# Patient Record
Sex: Female | Born: 1945 | Race: White | Hispanic: No | Marital: Married | Smoking: Former smoker
Health system: Southern US, Community
[De-identification: ages and names within clinical notes are randomized; demographics above are authoritative.]

## PROBLEM LIST (undated history)

## (undated) DIAGNOSIS — R430 Anosmia: Secondary | ICD-10-CM

## (undated) DIAGNOSIS — M549 Dorsalgia, unspecified: Secondary | ICD-10-CM

## (undated) DIAGNOSIS — M2550 Pain in unspecified joint: Secondary | ICD-10-CM

## (undated) DIAGNOSIS — C801 Malignant (primary) neoplasm, unspecified: Secondary | ICD-10-CM

## (undated) DIAGNOSIS — N816 Rectocele: Secondary | ICD-10-CM

## (undated) DIAGNOSIS — R432 Parageusia: Secondary | ICD-10-CM

## (undated) DIAGNOSIS — R609 Edema, unspecified: Secondary | ICD-10-CM

## (undated) DIAGNOSIS — K829 Disease of gallbladder, unspecified: Secondary | ICD-10-CM

## (undated) DIAGNOSIS — G510 Bell's palsy: Secondary | ICD-10-CM

## (undated) DIAGNOSIS — R7303 Prediabetes: Secondary | ICD-10-CM

## (undated) DIAGNOSIS — E785 Hyperlipidemia, unspecified: Secondary | ICD-10-CM

## (undated) DIAGNOSIS — R002 Palpitations: Secondary | ICD-10-CM

## (undated) DIAGNOSIS — R1310 Dysphagia, unspecified: Secondary | ICD-10-CM

## (undated) DIAGNOSIS — R0602 Shortness of breath: Secondary | ICD-10-CM

## (undated) DIAGNOSIS — I4891 Unspecified atrial fibrillation: Secondary | ICD-10-CM

## (undated) DIAGNOSIS — I10 Essential (primary) hypertension: Secondary | ICD-10-CM

## (undated) DIAGNOSIS — I8390 Asymptomatic varicose veins of unspecified lower extremity: Secondary | ICD-10-CM

## (undated) HISTORY — DX: Unspecified atrial fibrillation: I48.91

## (undated) HISTORY — DX: Dorsalgia, unspecified: M54.9

## (undated) HISTORY — DX: Prediabetes: R73.03

## (undated) HISTORY — PX: AV FISTULA PLACEMENT: SHX1204

## (undated) HISTORY — DX: Dysphagia, unspecified: R13.10

## (undated) HISTORY — DX: Malignant (primary) neoplasm, unspecified: C80.1

## (undated) HISTORY — DX: Shortness of breath: R06.02

## (undated) HISTORY — DX: Anosmia: R43.0

## (undated) HISTORY — DX: Palpitations: R00.2

## (undated) HISTORY — DX: Parageusia: R43.2

## (undated) HISTORY — DX: Rectocele: N81.6

## (undated) HISTORY — DX: Asymptomatic varicose veins of unspecified lower extremity: I83.90

## (undated) HISTORY — DX: Pain in unspecified joint: M25.50

## (undated) HISTORY — PX: VEIN REPAIR: SHX6524

## (undated) HISTORY — DX: Hyperlipidemia, unspecified: E78.5

## (undated) HISTORY — PX: ANKLE SURGERY: SHX546

## (undated) HISTORY — DX: Essential (primary) hypertension: I10

## (undated) HISTORY — PX: GASTRIC BYPASS: SHX52

## (undated) HISTORY — PX: OTHER SURGICAL HISTORY: SHX169

## (undated) HISTORY — PX: CHOLECYSTECTOMY: SHX55

## (undated) HISTORY — DX: Disease of gallbladder, unspecified: K82.9

## (undated) HISTORY — DX: Edema, unspecified: R60.9

## (undated) HISTORY — DX: Bell's palsy: G51.0

---

## 2008-03-01 DIAGNOSIS — G510 Bell's palsy: Secondary | ICD-10-CM

## 2008-03-01 HISTORY — DX: Bell's palsy: G51.0

## 2014-03-21 DIAGNOSIS — Z9884 Bariatric surgery status: Secondary | ICD-10-CM | POA: Diagnosis not present

## 2014-03-21 DIAGNOSIS — K8012 Calculus of gallbladder with acute and chronic cholecystitis without obstruction: Secondary | ICD-10-CM | POA: Diagnosis present

## 2014-03-21 DIAGNOSIS — E669 Obesity, unspecified: Secondary | ICD-10-CM | POA: Diagnosis present

## 2014-03-21 DIAGNOSIS — K8060 Calculus of gallbladder and bile duct with cholecystitis, unspecified, without obstruction: Secondary | ICD-10-CM | POA: Diagnosis not present

## 2014-03-21 DIAGNOSIS — I4891 Unspecified atrial fibrillation: Secondary | ICD-10-CM | POA: Diagnosis present

## 2014-03-21 DIAGNOSIS — Z6841 Body Mass Index (BMI) 40.0 and over, adult: Secondary | ICD-10-CM | POA: Diagnosis not present

## 2014-03-21 DIAGNOSIS — N179 Acute kidney failure, unspecified: Secondary | ICD-10-CM | POA: Diagnosis present

## 2014-03-21 DIAGNOSIS — E869 Volume depletion, unspecified: Secondary | ICD-10-CM | POA: Diagnosis present

## 2014-03-21 DIAGNOSIS — N189 Chronic kidney disease, unspecified: Secondary | ICD-10-CM | POA: Diagnosis present

## 2014-03-21 DIAGNOSIS — I129 Hypertensive chronic kidney disease with stage 1 through stage 4 chronic kidney disease, or unspecified chronic kidney disease: Secondary | ICD-10-CM | POA: Diagnosis present

## 2014-03-21 DIAGNOSIS — E785 Hyperlipidemia, unspecified: Secondary | ICD-10-CM | POA: Diagnosis present

## 2014-03-21 DIAGNOSIS — E876 Hypokalemia: Secondary | ICD-10-CM | POA: Diagnosis present

## 2014-03-21 DIAGNOSIS — R7309 Other abnormal glucose: Secondary | ICD-10-CM | POA: Diagnosis present

## 2014-03-29 DIAGNOSIS — I4891 Unspecified atrial fibrillation: Secondary | ICD-10-CM | POA: Diagnosis not present

## 2014-05-10 DIAGNOSIS — R0683 Snoring: Secondary | ICD-10-CM | POA: Diagnosis not present

## 2014-05-10 DIAGNOSIS — G4710 Hypersomnia, unspecified: Secondary | ICD-10-CM | POA: Diagnosis not present

## 2015-12-05 ENCOUNTER — Ambulatory Visit (INDEPENDENT_AMBULATORY_CARE_PROVIDER_SITE_OTHER): Payer: Managed Care, Other (non HMO) | Admitting: *Deleted

## 2015-12-05 DIAGNOSIS — Z23 Encounter for immunization: Secondary | ICD-10-CM | POA: Diagnosis not present

## 2016-03-02 DIAGNOSIS — J069 Acute upper respiratory infection, unspecified: Secondary | ICD-10-CM | POA: Diagnosis not present

## 2016-04-06 ENCOUNTER — Encounter (INDEPENDENT_AMBULATORY_CARE_PROVIDER_SITE_OTHER): Payer: Managed Care, Other (non HMO) | Admitting: Family Medicine

## 2016-04-15 ENCOUNTER — Ambulatory Visit (INDEPENDENT_AMBULATORY_CARE_PROVIDER_SITE_OTHER): Payer: Managed Care, Other (non HMO) | Admitting: Family Medicine

## 2016-04-21 ENCOUNTER — Encounter (INDEPENDENT_AMBULATORY_CARE_PROVIDER_SITE_OTHER): Payer: Self-pay | Admitting: Family Medicine

## 2016-04-21 ENCOUNTER — Ambulatory Visit (INDEPENDENT_AMBULATORY_CARE_PROVIDER_SITE_OTHER): Payer: Managed Care, Other (non HMO) | Admitting: Family Medicine

## 2016-04-21 VITALS — BP 107/73 | HR 58 | Temp 97.9°F | Ht 63.0 in | Wt 222.0 lb

## 2016-04-21 DIAGNOSIS — R5383 Other fatigue: Secondary | ICD-10-CM | POA: Insufficient documentation

## 2016-04-21 DIAGNOSIS — R0602 Shortness of breath: Secondary | ICD-10-CM | POA: Diagnosis not present

## 2016-04-21 DIAGNOSIS — Z9189 Other specified personal risk factors, not elsewhere classified: Secondary | ICD-10-CM

## 2016-04-21 DIAGNOSIS — Z0289 Encounter for other administrative examinations: Secondary | ICD-10-CM

## 2016-04-21 DIAGNOSIS — R739 Hyperglycemia, unspecified: Secondary | ICD-10-CM

## 2016-04-21 DIAGNOSIS — E785 Hyperlipidemia, unspecified: Secondary | ICD-10-CM

## 2016-04-21 DIAGNOSIS — E559 Vitamin D deficiency, unspecified: Secondary | ICD-10-CM

## 2016-04-21 DIAGNOSIS — Z9884 Bariatric surgery status: Secondary | ICD-10-CM | POA: Insufficient documentation

## 2016-04-21 DIAGNOSIS — Z6839 Body mass index (BMI) 39.0-39.9, adult: Secondary | ICD-10-CM | POA: Diagnosis not present

## 2016-04-21 DIAGNOSIS — IMO0001 Reserved for inherently not codable concepts without codable children: Secondary | ICD-10-CM | POA: Insufficient documentation

## 2016-04-21 DIAGNOSIS — Z1389 Encounter for screening for other disorder: Secondary | ICD-10-CM | POA: Diagnosis not present

## 2016-04-21 DIAGNOSIS — Z1331 Encounter for screening for depression: Secondary | ICD-10-CM

## 2016-04-21 DIAGNOSIS — E669 Obesity, unspecified: Secondary | ICD-10-CM | POA: Diagnosis not present

## 2016-04-21 NOTE — Progress Notes (Signed)
Office: [phone]  /  Fax: [phone]   HPI:   Chief Complaint: OBESITY  Jaclyn Lowe (MR# [id number]) is a 71 y.o. female who presents on 04/21/2016 for obesity evaluation and treatment. Current BMI is Body mass index is 39.33 kg/m.Jaclyn Lowe has struggled with obesity for years and has been unsuccessful in either losing weight or maintaining long term weight loss. Jaclyn Lowe attended our information session and states she is currently in the action stage of change and ready to dedicate time achieving and maintaining a healthier weight.  Jaclyn Lowe states her desired weight loss is 47 lbs. she has been heavy most of  her life she skips meals frequently she is frequently drinking liquids with calories she frequently makes poor food choices she struggles with emotional eating    Fatigue Jaclyn Lowe feels her energy is lower than it should be. This has worsened with weight gain and has worsened recently. Jaclyn Lowe admits to daytime somnolence and  did not report waking up still tired. Patient is at risk for obstructive sleep apnea. Patient generally gets 7 hours of sleep per night, and states they generally have generally restful sleep. Snoring is not present. Apneic episodes is not present. Epworth Sleepiness Score is 9.  Dyspnea on exertion [facility] notes increasing shortness of breath with exercising and seems to be worsening over time with weight gain. She notes getting out of breath sooner with activity than she used to. This has gotten worse recently. Jaclyn Lowe denies orthopnea.  Hyperglycemia Jaclyn Lowe has a history of some elevated blood glucose readings without a diagnosis of diabetes. She did not report any polyphagia. Patient has an history of increased blood sugars but no diagnosis of Diabetes Mellitus. No recent labs drawn.   Vitamin D deficiency Jaclyn Lowe has a diagnosis of vitamin D deficiency. She is not currently taking vit D and denies nausea, vomiting or muscle weakness. No recent labs drawn, but at risk for  deficiency due to gastric bypass surgery.   Hyperlipidemia Jaclyn Lowe has hyperlipidemia and has been trying to improve her cholesterol levels with intensive lifestyle modification including a low saturated fat diet, exercise and weight loss. She denies any chest pain, claudication or myalgias. She is currently on a statin.    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   At risk for diabetes Jaclyn Lowe is at higher than averagerisk for developing diabetes due to her obesity. She currently denies polyuria or polydipsia.  Depression Screen Jaclyn Lowe's Food and Mood (modified PHQ-9) score was  Depression screen PHQ 2/9 04/21/2016  Decreased Interest 2  Down, Depressed, Hopeless 2  PHQ - 2 Score 4  Altered sleeping 1  Tired, decreased energy 3  Change in appetite 3  Feeling bad or failure about yourself  2  Trouble concentrating 2  Moving slowly or fidgety/restless 2  Suicidal thoughts 0  PHQ-9 Score 17    ALLERGIES: Allergies  Allergen Reactions  . Neosporin [Neomycin-Bacitracin Zn-Polymyx] Itching and Rash    MEDICATIONS: No current outpatient prescriptions on file prior to visit.   No current facility-administered medications on file prior to visit.     PAST MEDICAL HISTORY: Past Medical History:  Diagnosis Date  . A-fib ([place])   . Back pain   . Cancer ([place])    basal cell on the nose  . Edema    feet and legs  . Gallbladder disease   . HTN (hypertension)   . Hyperlipidemia   . Joint pain   . Loss of smell   .  Loss of taste   . Palpitations   . Prediabetes   . Rectoceles   . SOB (shortness of breath)   . Swallowing difficulty   . Varicose vein of leg     PAST SURGICAL HISTORY: Past Surgical History:  Procedure Laterality Date  . ANKLE SURGERY     left ankle  . AV FISTULA PLACEMENT     leg  . CHOLECYSTECTOMY    . GASTRIC BYPASS    . tummy tuck    . VEIN REPAIR      SOCIAL HISTORY: Social History  Substance Use Topics  . Smoking status:  Former Research scientist (life sciences)  . Smokeless tobacco: Never Used  . Alcohol use Yes     Comment: occasional    FAMILY HISTORY: Family History  Problem Relation Age of Onset  . Diabetes Mother   . Hyperlipidemia Mother   . Sudden death Mother   . Obesity Mother   . Hypertension Father   . Hyperlipidemia Father   . Heart disease Father   . Sudden death Father     ROS: Review of Systems  Constitutional: Positive for malaise/fatigue.  HENT:       Nose stuffiness Hay fever  Eyes:       Wear glasses or contacts  Respiratory: Positive for cough and shortness of breath (with activity).   Cardiovascular:       Leg Cramping  Gastrointestinal: Positive for constipation and diarrhea.       Difficulty swallowing  Musculoskeletal: Positive for back pain and joint pain.  All other systems reviewed and are negative.   PHYSICAL EXAM: Blood pressure 107/73, pulse (!) 58, temperature 97.9 F (36.6 C), temperature source Oral, height 5\' 3"  (1.6 m), weight 222 lb (100.7 kg), SpO2 96 %. Body mass index is 39.33 kg/m. Physical Exam  Constitutional: She is oriented to person, place, and time. She appears well-developed and well-nourished.  HENT:  Head: Normocephalic and atraumatic.  Eyes: Pupils are equal, round, and reactive to light.  Neck: Normal range of motion.  Cardiovascular: Normal rate.   Irregularly Irregular  Pulmonary/Chest: Effort normal and breath sounds normal.  Abdominal: Soft. Bowel sounds are normal.  Musculoskeletal: Normal range of motion. She exhibits edema (on BLE).  Neurological: She is alert and oriented to person, place, and time.  Skin: Skin is warm and dry.  Psychiatric: She has a normal mood and affect. Her behavior is normal.  Vitals reviewed.   RECENT LABS AND TESTS: BMET No results found for: NA, K, CL, CO2, GLUCOSE, BUN, CREATININE, CALCIUM, GFRNONAA, GFRAA No results found for: HGBA1C No results found for: INSULIN CBC No results found for: WBC, RBC, HGB, HCT,  PLT, MCV, MCH, MCHC, RDW, LYMPHSABS, MONOABS, EOSABS, BASOSABS Iron/TIBC/Ferritin/ %Sat No results found for: IRON, TIBC, FERRITIN, IRONPCTSAT Lipid Panel  No results found for: CHOL, TRIG, HDL, CHOLHDL, VLDL, LDLCALC, LDLDIRECT Hepatic Function Panel  No results found for: PROT, ALBUMIN, AST, ALT, ALKPHOS, BILITOT, BILIDIR, IBILI No results found for: TSH  ECG  shows NSR with a rate of 60 bpm. INDIRECT CALORIMETER done today shows a VO2 of 181 and a REE of 1258.   ASSESSMENT AND PLAN: Fatigue, unspecified type - Plan: EKG 12-Lead, Comprehensive metabolic panel, CBC with Differential/Platelet, Vitamin B12, Folate, TSH, T4, free, T3  Shortness of breath on exertion  Hyperglycemia - Plan: Hemoglobin A1c, Insulin, random  Vitamin D deficiency - Plan: VITAMIN D 25 Hydroxy (Vit-D Deficiency, Fractures)  Hyperlipidemia, unspecified hyperlipidemia type - Plan: Lipid Panel With  LDL/HDL Ratio  Status post gastric bypass for obesity  Depression screening  At risk for diabetes mellitus  Class 2 obesity with serious comorbidity and body mass index (BMI) of 39.0 to 39.9 in adult, unspecified obesity type  PLAN:  Fatigue Shakeithia was informed that her fatigue may be related to obesity, depression or many other causes. Labs will be ordered, and in the meanwhile Nataline has agreed to work on diet, exercise and weight loss to help with fatigue. Proper sleep hygiene was discussed including the need for 7-8 hours of quality sleep each night. A sleep study was not ordered based on symptoms and Epworth score.  Dyspnea on exertion Marquesha's shortness of breath appears to be obesity related and exercise induced. She has agreed to work on weight loss and gradually increase exercise to treat her exercise induced shortness of breath. If Nykia follows our instructions and loses weight without improvement of her shortness of breath, we will plan to refer to pulmonology. We will monitor this condition regularly. Inta  agrees to this plan.  Hyperglycemia Fasting labs will be obtained and results with be discussed with Jaclyn Lowe in 2 weeks at her follow up visit. In the meanwhile Asley was started on a lower simple carbohydrate diet and will work on weight loss efforts. Will check labs and follow up.   Vitamin D Deficiency Meshia was informed that low vitamin D levels contributes to fatigue and are associated with obesity, breast, and colon cancer. She agrees to continue to take prescription Vit D @50 ,000 IU every week and will follow up for routine testing of vitamin D, at least 2-3 times per year. She was informed of the risk of over-replacement of vitamin D and agrees to not increase her dose unless he discusses this with [name] first. Will check labs and follow up.   Hyperlipidemia Bailyn was informed of the American Heart Association Guidelines emphasizing intensive lifestyle modifications as the first line treatment for hyperlipidemia. We discussed many lifestyle modifications today in depth, and Wilbert will continue to work on decreasing saturated fats such as fatty red meat, butter and many fried foods. She will also increase vegetables and lean protein in her diet and continue to work on exercise and weight loss efforts. Will check labs continue with diet and exercise. Will follow up.   Status Post Gastric Bypass Will continue to monitor at this time.   Diabetes risk counselling Delisia was given extended (at least 15 minutes) diabetes prevention counseling today. She is 71 y.o. female and has risk factors for diabetes including obesity. We discussed intensive lifestyle modifications today with an emphasis on weight loss as well as increasing exercise and decreasing simple carbohydrates in her diet.  Depression Screen Bryona had a positive depression screening. Depression is commonly associated with obesity and often results in emotional eating behaviors. We will monitor this closely and work on CBT to help improve the  non-hunger eating patterns. Referral to Psychology may be required if no improvement is seen as she continues in our clinic.  Obesity Shakeena is currently in the action stage of change and her goal is to continue with weight loss efforts She has agreed to follow the Category 1 plan Glennetta has been instructed to work up to a goal of 150 minutes of combined cardio and strengthening exercise per week for weight loss and overall health benefits. We discussed the following Behavioral Modification Stratagies today: increasing lean protein intake, decreasing simple carbohydrates , increasing vegetables, work on meal planning and easy  cooking plans and decrease carbonated liquids.  Jerzy has agreed to follow up with our clinic in 2 weeks. She was informed of the importance of frequent follow up visits to maximize her success with intensive lifestyle modifications for her multiple health conditions. She was informed we would discuss her lab results at her next visit unless there is a critical issue that needs to be addressed sooner. Graciela agreed to keep her next visit at the agreed upon time to discuss these results.  I, April Moore, am acting as Education administrator for Dennard Nip, MD  I have reviewed the above documentation for accuracy and completeness, and I agree with the above. -Dennard Nip, MD

## 2016-04-22 ENCOUNTER — Other Ambulatory Visit (HOSPITAL_COMMUNITY): Payer: Self-pay | Admitting: Pharmacist

## 2016-04-22 LAB — CBC WITH DIFFERENTIAL/PLATELET
Basophils Absolute: 0 10*3/uL (ref 0.0–0.2)
Basos: 0 %
EOS (ABSOLUTE): 0 10*3/uL (ref 0.0–0.4)
Eos: 1 %
Hematocrit: 34.7 % (ref 34.0–46.6)
Hemoglobin: 11.4 g/dL (ref 11.1–15.9)
Immature Grans (Abs): 0 10*3/uL (ref 0.0–0.1)
Immature Granulocytes: 0 %
Lymphocytes Absolute: 0.8 10*3/uL (ref 0.7–3.1)
Lymphs: 14 %
MCH: 29.5 pg (ref 26.6–33.0)
MCHC: 32.9 g/dL (ref 31.5–35.7)
MCV: 90 fL (ref 79–97)
Monocytes Absolute: 0.4 10*3/uL (ref 0.1–0.9)
Monocytes: 7 %
Neutrophils Absolute: 4.2 10*3/uL (ref 1.4–7.0)
Neutrophils: 78 %
Platelets: 227 10*3/uL (ref 150–379)
RBC: 3.87 x10E6/uL (ref 3.77–5.28)
RDW: 14.2 % (ref 12.3–15.4)
WBC: 5.4 10*3/uL (ref 3.4–10.8)

## 2016-04-22 LAB — COMPREHENSIVE METABOLIC PANEL WITH GFR
ALT: 17 [IU]/L (ref 0–32)
AST: 18 [IU]/L (ref 0–40)
Albumin/Globulin Ratio: 1.6 (ref 1.2–2.2)
Albumin: 4 g/dL (ref 3.5–4.8)
Alkaline Phosphatase: 67 [IU]/L (ref 39–117)
BUN/Creatinine Ratio: 19 (ref 12–28)
BUN: 20 mg/dL (ref 8–27)
Bilirubin Total: 0.8 mg/dL (ref 0.0–1.2)
CO2: 24 mmol/L (ref 18–29)
Calcium: 9 mg/dL (ref 8.7–10.3)
Chloride: 100 mmol/L (ref 96–106)
Creatinine, Ser: 1.08 mg/dL — ABNORMAL HIGH (ref 0.57–1.00)
GFR calc Af Amer: 60
GFR calc non Af Amer: 52 — ABNORMAL LOW
Globulin, Total: 2.5 (ref 1.5–4.5)
Glucose: 101 mg/dL — ABNORMAL HIGH (ref 65–99)
Potassium: 3.9 mmol/L (ref 3.5–5.2)
Sodium: 142 mmol/L (ref 134–144)
Total Protein: 6.5 g/dL (ref 6.0–8.5)

## 2016-04-22 LAB — LIPID PANEL WITH LDL/HDL RATIO
Cholesterol, Total: 124 mg/dL (ref 100–199)
HDL: 42 mg/dL
LDL Calculated: 61 (ref 0–99)
LDl/HDL Ratio: 1.5 (ref 0.0–3.2)
Triglycerides: 104 mg/dL (ref 0–149)
VLDL Cholesterol Cal: 21 (ref 5–40)

## 2016-04-22 LAB — HEMOGLOBIN A1C
Est. average glucose Bld gHb Est-mCnc: 120
Hgb A1c MFr Bld: 5.8 % — ABNORMAL HIGH (ref 4.8–5.6)

## 2016-04-22 LAB — VITAMIN B12: Vitamin B-12: 287 pg/mL (ref 232–1245)

## 2016-04-22 LAB — VITAMIN D 25 HYDROXY (VIT D DEFICIENCY, FRACTURES): VIT D 25 HYDROXY: 9.5 ng/mL — AB (ref 30.0–100.0)

## 2016-04-22 LAB — T3: T3 TOTAL: 105 ng/dL (ref 71–180)

## 2016-04-22 LAB — T4, FREE: Free T4: 1.19 ng/dL (ref 0.82–1.77)

## 2016-04-22 LAB — FOLATE: Folate: 11.6 ng/mL (ref >3.0)

## 2016-04-22 LAB — INSULIN, RANDOM: INSULIN: 8.3 u[IU]/mL (ref 2.6–24.9)

## 2016-04-22 LAB — TSH: TSH: 1.41 u[IU]/mL (ref 0.450–4.500)

## 2016-05-05 ENCOUNTER — Ambulatory Visit (INDEPENDENT_AMBULATORY_CARE_PROVIDER_SITE_OTHER): Payer: Managed Care, Other (non HMO) | Admitting: Family Medicine

## 2016-05-05 VITALS — BP 104/62 | HR 74 | Temp 97.9°F | Ht 63.0 in | Wt 213.0 lb

## 2016-05-05 DIAGNOSIS — Z9889 Other specified postprocedural states: Secondary | ICD-10-CM | POA: Diagnosis not present

## 2016-05-05 DIAGNOSIS — Z9189 Other specified personal risk factors, not elsewhere classified: Secondary | ICD-10-CM | POA: Diagnosis not present

## 2016-05-05 DIAGNOSIS — Z6837 Body mass index (BMI) 37.0-37.9, adult: Secondary | ICD-10-CM

## 2016-05-05 DIAGNOSIS — I10 Essential (primary) hypertension: Secondary | ICD-10-CM

## 2016-05-05 DIAGNOSIS — E559 Vitamin D deficiency, unspecified: Secondary | ICD-10-CM

## 2016-05-05 DIAGNOSIS — R7303 Prediabetes: Secondary | ICD-10-CM | POA: Diagnosis not present

## 2016-05-05 DIAGNOSIS — E669 Obesity, unspecified: Secondary | ICD-10-CM | POA: Diagnosis not present

## 2016-05-05 MED ORDER — VITAMIN D3 1.25 MG (50000 UT) PO CAPS: 1.0000 | ORAL_CAPSULE | ORAL | 0 refills | Status: DC

## 2016-05-05 MED ORDER — LISINOPRIL 20 MG PO TABS: 20.0000 mg | ORAL_TABLET | Freq: Every day | ORAL | 0 refills | Status: DC

## 2016-05-05 NOTE — Progress Notes (Signed)
Office: [phone]  /  Fax: [phone]   HPI:   Chief Complaint: OBESITY Jaclyn Lowe is here to discuss her progress with her obesity treatment plan. She is following her eating plan approximately 95 % of the time and states she is exercising 0 minutes 0 times per week. [name] did well with weight loss. She was bored with plan and deviated from many of the meals, sometimes didn't eat all her food. She notes significant carbohydrate cravings and found simple carbohydrates difficult. Her weight is 213 lb (96.6 kg) today and has had a weight loss of 9 pounds over a period of 2 weeks since her last visit. She has lost 9 lbs since starting treatment with [name].  Hypertension Jaclyn Lowe is a 71 y.o. female with hypertension. Her blood pressure dropped and she is feeling more lightheaded. Creatinine also slightly elevated and she is having some muscle cramping. [name] denies chest pain or shortness of breath on exertion. She is working weight loss to help control her blood pressure with the goal of decreasing her risk of heart attack and stroke. [name] blood pressure is not currently controlled.  At risk for diabetes Jaclyn Lowe is at higher than average risk for developing diabetes due to her obesity. She currently denies polyuria or polydipsia.  Wt Readings from Last 500 Encounters:  05/05/16 213 lb (96.6 kg)  04/21/16 222 lb (100.7 kg)     ALLERGIES: Allergies  Allergen Reactions  . Neosporin [Neomycin-Bacitracin Zn-Polymyx] Itching and Rash    MEDICATIONS: Current Outpatient Prescriptions on File Prior to Visit  Medication Sig Dispense Refill  . apixaban (ELIQUIS) 5 MG TABS tablet Take 5 mg by mouth 2 (two) times daily.    Marland Kitchen atorvastatin (LIPITOR) 10 MG tablet Take 10 mg by mouth daily.     No current facility-administered medications on file prior to visit.     PAST MEDICAL HISTORY: Past Medical History:  Diagnosis Date  . A-fib ([place])   . Back pain   . Cancer ([place])    basal cell on  the nose  . Edema    feet and legs  . Gallbladder disease   . HTN (hypertension)   . Hyperlipidemia   . Joint pain   . Loss of smell   . Loss of taste   . Palpitations   . Prediabetes   . Rectoceles   . SOB (shortness of breath)   . Swallowing difficulty   . Varicose vein of leg     PAST SURGICAL HISTORY: Past Surgical History:  Procedure Laterality Date  . ANKLE SURGERY     left ankle  . AV FISTULA PLACEMENT     leg  . CHOLECYSTECTOMY    . GASTRIC BYPASS    . tummy tuck    . VEIN REPAIR      SOCIAL HISTORY: Social History  Substance Use Topics  . Smoking status: Former Research scientist (life sciences)  . Smokeless tobacco: Never Used  . Alcohol use Yes     Comment: occasional    FAMILY HISTORY: Family History  Problem Relation Age of Onset  . Diabetes Mother   . Hyperlipidemia Mother   . Sudden death Mother   . Obesity Mother   . Hypertension Father   . Hyperlipidemia Father   . Heart disease Father   . Sudden death Father     ROS: Review of Systems  Constitutional: Positive for weight loss.  Cardiovascular:       Lightheaded  Genitourinary: Negative for frequency.  Endo/Heme/Allergies:  Negative for polydipsia.    PHYSICAL EXAM: Blood pressure 104/62, pulse 74, temperature 97.9 F (36.6 C), temperature source Oral, height 5\' 3"  (1.6 m), weight 213 lb (96.6 kg), SpO2 93 %. Body mass index is 37.73 kg/m. Physical Exam  Constitutional: She is oriented to person, place, and time. She appears well-developed and well-nourished.  Cardiovascular: Normal rate.   Pulmonary/Chest: Effort normal.  Musculoskeletal: Normal range of motion.  Neurological: She is oriented to person, place, and time.  Skin: Skin is warm and dry.  Psychiatric: She has a normal mood and affect. Her behavior is normal.  Vitals reviewed.   RECENT LABS AND TESTS: BMET    Component Value Date/Time   NA 142 04/21/2016 1157   K 3.9 04/21/2016 1157   CL 100 04/21/2016 1157   CO2 24 04/21/2016 1157    GLUCOSE 101 (H) 04/21/2016 1157   BUN 20 04/21/2016 1157   CREATININE 1.08 (H) 04/21/2016 1157   CALCIUM 9.0 04/21/2016 1157   GFRNONAA 52 (L) 04/21/2016 1157   GFRAA 60 04/21/2016 1157   Lab Results  Component Value Date   HGBA1C 5.8 (H) 04/21/2016   Lab Results  Component Value Date   INSULIN 8.3 04/21/2016   CBC    Component Value Date/Time   WBC 5.4 04/21/2016 1157   RBC 3.87 04/21/2016 1157   HCT 34.7 04/21/2016 1157   PLT 227 04/21/2016 1157   MCV 90 04/21/2016 1157   MCH 29.5 04/21/2016 1157   MCHC 32.9 04/21/2016 1157   RDW 14.2 04/21/2016 1157   LYMPHSABS 0.8 04/21/2016 1157   EOSABS 0.0 04/21/2016 1157   BASOSABS 0.0 04/21/2016 1157   Iron/TIBC/Ferritin/ %Sat No results found for: IRON, TIBC, FERRITIN, IRONPCTSAT Lipid Panel     Component Value Date/Time   CHOL 124 04/21/2016 1157   TRIG 104 04/21/2016 1157   HDL 42 04/21/2016 1157   LDLCALC 61 04/21/2016 1157   Hepatic Function Panel     Component Value Date/Time   PROT 6.5 04/21/2016 1157   ALBUMIN 4.0 04/21/2016 1157   AST 18 04/21/2016 1157   ALT 17 04/21/2016 1157   ALKPHOS 67 04/21/2016 1157   BILITOT 0.8 04/21/2016 1157      Component Value Date/Time   TSH 1.410 04/21/2016 1157    ASSESSMENT AND PLAN: Essential hypertension - Plan: lisinopril (PRINIVIL,ZESTRIL) 20 MG tablet  Vitamin D deficiency - Plan: Cholecalciferol (VITAMIN D3) 50000 units CAPS  Status post gastric surgery  Prediabetes  Class 2 obesity without serious comorbidity with body mass index (BMI) of 37.0 to 37.9 in adult, unspecified obesity type  PLAN:  Hypertension We discussed sodium restriction, working on healthy weight loss, and a regular exercise program as the means to achieve improved blood pressure control. Karis agreed with this plan and agreed to follow up as directed. We will continue to monitor her blood pressure as well as her progress with the above lifestyle modifications. She agrees to discontinue  Lisinopril/HCTZ and start Lisinopril 20 mg every morning #30 with no reills and will follow up in 2 weeks to re-check blood pressure and will watch for signs of hypotension as she continues her lifestyle modifications.  Vitamin D Deficiency Donnice was informed that low vitamin D levels contributes to fatigue and are associated with obesity, breast, and colon cancer. She agrees to continue to take prescription Vit D @50 ,000 IU every week and will follow up for routine testing of vitamin D, at least 2-3 times per year. She was informed  of the risk of over-replacement of vitamin D and agrees to not increase her dose unless he discusses this with [name] first.  Pre-Diabetes Maedell will continue to work on weight loss, exercise, and decreasing simple carbohydrates in her diet to help decrease the risk of diabetes. We dicussed metformin including benefits and risks. She was informed that eating too many simple carbohydrates or too many calories at one sitting increases the likelihood of GI side effects. A prescription was not written today due to her renal concerns. Danne agreed to follow up with [name] as directed to monitor her progress.   Diabetes risk counselling Aara was given extended (at least 30 minutes) diabetes prevention counseling today. She is 71 y.o. female and has risk factors for diabetes including obesity. We discussed intensive lifestyle modifications today with an emphasis on weight loss as well as increasing exercise and decreasing simple carbohydrates in her diet. We discussed low sugar, healthy snack options at length for ongoing weight loss and better glucose management to be included in category 1 meal plan. Tasnia was encouraged to increase fluid intake to approximately 2000 ml per day for adequate hydration.  Obesity Sheral is currently in the action stage of change. As such, her goal is to continue with weight loss efforts She has agreed to follow the Category 1 plan Sheri has been instructed to  work up to a goal of 150 minutes of combined cardio and strengthening exercise per week for weight loss and overall health benefits. We discussed the following Behavioral Modification Stratagies today: decreasing simple carbohydrates , ways to avoid boredom eating and ways to avoid night time snacking  Essa has agreed to follow up with our clinic in 2 weeks. She was informed of the importance of frequent follow up visits to maximize her success with intensive lifestyle modifications for her multiple health conditions.  I, Doreene Nest, am acting as scribe for Dennard Nip, MD  I have reviewed the above documentation for accuracy and completeness, and I agree with the above. -Dennard Nip, MD

## 2016-05-20 ENCOUNTER — Ambulatory Visit (INDEPENDENT_AMBULATORY_CARE_PROVIDER_SITE_OTHER): Payer: Managed Care, Other (non HMO) | Admitting: Family Medicine

## 2016-05-20 VITALS — BP 143/85 | HR 76 | Temp 97.5°F | Ht 63.0 in | Wt 218.0 lb

## 2016-05-20 DIAGNOSIS — F32A Depression, unspecified: Secondary | ICD-10-CM | POA: Insufficient documentation

## 2016-05-20 DIAGNOSIS — F3289 Other specified depressive episodes: Secondary | ICD-10-CM | POA: Diagnosis not present

## 2016-05-20 DIAGNOSIS — I10 Essential (primary) hypertension: Secondary | ICD-10-CM | POA: Diagnosis not present

## 2016-05-20 DIAGNOSIS — E559 Vitamin D deficiency, unspecified: Secondary | ICD-10-CM | POA: Diagnosis not present

## 2016-05-20 DIAGNOSIS — E669 Obesity, unspecified: Secondary | ICD-10-CM | POA: Diagnosis not present

## 2016-05-20 DIAGNOSIS — Z6838 Body mass index (BMI) 38.0-38.9, adult: Secondary | ICD-10-CM | POA: Diagnosis not present

## 2016-05-20 DIAGNOSIS — F329 Major depressive disorder, single episode, unspecified: Secondary | ICD-10-CM | POA: Insufficient documentation

## 2016-05-20 MED ORDER — VITAMIN D3 1.25 MG (50000 UT) PO CAPS: 1.0000 | ORAL_CAPSULE | ORAL | 0 refills | Status: DC

## 2016-05-20 MED ORDER — LISINOPRIL 20 MG PO TABS: 20.0000 mg | ORAL_TABLET | Freq: Every day | ORAL | 0 refills | Status: DC

## 2016-05-20 MED ORDER — TOPIRAMATE 25 MG PO TABS: 25.0000 mg | ORAL_TABLET | Freq: Every day | ORAL | 0 refills | Status: DC

## 2016-05-20 NOTE — Progress Notes (Signed)
Office: [phone]  /  Fax: [phone]   HPI:   Chief Complaint: OBESITY Jaclyn Lowe is here to discuss her progress with her obesity treatment plan. She is following her eating plan approximately 80 % of the time and states she is exercising 0 minutes 0 times per week. Ivis started deviating from plan more over the last 2 weeks. She notes eating larger volumes of food and snacking on more candy. She is retaining some fluid while off. Her weight is 218 lb (98.9 kg) today and has had a weight gain of 5 lbs over a period of 2 weeks since her last visit. She has lost 4 lbs since starting treatment with [name].  Vitamin D deficiency Jaclyn Lowe has a diagnosis of vitamin D deficiency. She is currently stable on vit D. She still notes fatigue and denies nausea, vomiting or muscle weakness.  Hypertension Jaclyn Lowe is a 71 y.o. female with hypertension.  Jaclyn Lowe denies chest pain or shortness of breath on exertion. She is working weight loss to help control her blood pressure with the goal of decreasing her risk of heart attack and stroke. She states she is stressed with traffic problems and almost being late to clinic. Blood pressure is elevated today at 143/85. Jaclyn Lowe blood pressure is not currently controlled.  Depression with emotional eating behaviors Jaclyn Lowe is struggling with emotional eating and using food for comfort to the extent that it is negatively impacting her health. She often snacks when she is not hungry. Jaclyn Lowe sometimes feels she is out of control and then feels guilty that she made poor food choices. She notes depressed mood. She has been working on behavior modification techniques to help reduce her emotional eating and has been somewhat successful. She shows no sign of suicidal or homicidal ideations.  Depression screen PHQ 2/9 04/21/2016  Decreased Interest 2  Down, Depressed, Hopeless 2  PHQ - 2 Score 4  Altered sleeping 1  Tired, decreased energy 3  Change in appetite 3  Feeling bad  or failure about yourself  2  Trouble concentrating 2  Moving slowly or fidgety/restless 2  Suicidal thoughts 0  PHQ-9 Score 17     Wt Readings from Last 500 Encounters:  05/20/16 218 lb (98.9 kg)  05/05/16 213 lb (96.6 kg)  04/21/16 222 lb (100.7 kg)     ALLERGIES: Allergies  Allergen Reactions  . Neosporin [Neomycin-Bacitracin Zn-Polymyx] Itching and Rash    MEDICATIONS: Current Outpatient Prescriptions on File Prior to Visit  Medication Sig Dispense Refill  . apixaban (ELIQUIS) 5 MG TABS tablet Take 5 mg by mouth 2 (two) times daily.    Marland Kitchen atorvastatin (LIPITOR) 10 MG tablet Take 10 mg by mouth daily.     No current facility-administered medications on file prior to visit.     PAST MEDICAL HISTORY: Past Medical History:  Diagnosis Date  . A-fib ([place])   . Back pain   . Cancer ([place])    basal cell on the nose  . Edema    feet and legs  . Gallbladder disease   . HTN (hypertension)   . Hyperlipidemia   . Joint pain   . Loss of smell   . Loss of taste   . Palpitations   . Prediabetes   . Rectoceles   . SOB (shortness of breath)   . Swallowing difficulty   . Varicose vein of leg     PAST SURGICAL HISTORY: Past Surgical History:  Procedure Laterality Date  . ANKLE SURGERY  left ankle  . AV FISTULA PLACEMENT     leg  . CHOLECYSTECTOMY    . GASTRIC BYPASS    . tummy tuck    . VEIN REPAIR      SOCIAL HISTORY: Social History  Substance Use Topics  . Smoking status: Former Research scientist (life sciences)  . Smokeless tobacco: Never Used  . Alcohol use Yes     Comment: occasional    FAMILY HISTORY: Family History  Problem Relation Age of Onset  . Diabetes Mother   . Hyperlipidemia Mother   . Sudden death Mother   . Obesity Mother   . Hypertension Father   . Hyperlipidemia Father   . Heart disease Father   . Sudden death Father     ROS: Review of Systems  Constitutional: Positive for malaise/fatigue.  Respiratory: Negative for shortness of breath (onn  exertion).   Cardiovascular: Negative for chest pain.  Gastrointestinal: Negative for nausea and vomiting.  Musculoskeletal:       Negative muscle weakness  Psychiatric/Behavioral: Positive for depression. Negative for suicidal ideas.    PHYSICAL EXAM: Blood pressure (!) 143/85, pulse 76, temperature 97.5 F (36.4 C), temperature source Oral, height 5\' 3"  (1.6 m), weight 218 lb (98.9 kg), SpO2 98 %. Body mass index is 38.62 kg/m. Physical Exam  Constitutional: She is oriented to person, place, and time. She appears well-developed and well-nourished.  Pulmonary/Chest: Effort normal.  Musculoskeletal: Normal range of motion.  Neurological: She is oriented to person, place, and time.  Skin: Skin is warm and dry.  Vitals reviewed.   RECENT LABS AND TESTS: BMET    Component Value Date/Time   NA 142 04/21/2016 1157   K 3.9 04/21/2016 1157   CL 100 04/21/2016 1157   CO2 24 04/21/2016 1157   GLUCOSE 101 (H) 04/21/2016 1157   BUN 20 04/21/2016 1157   CREATININE 1.08 (H) 04/21/2016 1157   CALCIUM 9.0 04/21/2016 1157   GFRNONAA 52 (L) 04/21/2016 1157   GFRAA 60 04/21/2016 1157   Lab Results  Component Value Date   HGBA1C 5.8 (H) 04/21/2016   Lab Results  Component Value Date   INSULIN 8.3 04/21/2016   CBC    Component Value Date/Time   WBC 5.4 04/21/2016 1157   RBC 3.87 04/21/2016 1157   HCT 34.7 04/21/2016 1157   PLT 227 04/21/2016 1157   MCV 90 04/21/2016 1157   MCH 29.5 04/21/2016 1157   MCHC 32.9 04/21/2016 1157   RDW 14.2 04/21/2016 1157   LYMPHSABS 0.8 04/21/2016 1157   EOSABS 0.0 04/21/2016 1157   BASOSABS 0.0 04/21/2016 1157   Iron/TIBC/Ferritin/ %Sat No results found for: IRON, TIBC, FERRITIN, IRONPCTSAT Lipid Panel     Component Value Date/Time   CHOL 124 04/21/2016 1157   TRIG 104 04/21/2016 1157   HDL 42 04/21/2016 1157   LDLCALC 61 04/21/2016 1157   Hepatic Function Panel     Component Value Date/Time   PROT 6.5 04/21/2016 1157   ALBUMIN 4.0  04/21/2016 1157   AST 18 04/21/2016 1157   ALT 17 04/21/2016 1157   ALKPHOS 67 04/21/2016 1157   BILITOT 0.8 04/21/2016 1157      Component Value Date/Time   TSH 1.410 04/21/2016 1157    ASSESSMENT AND PLAN: Essential hypertension - Plan: lisinopril (PRINIVIL,ZESTRIL) 20 MG tablet  Other depression - Plan: topiramate (TOPAMAX) 25 MG tablet  Vitamin D deficiency - Plan: Cholecalciferol (VITAMIN D3) 50000 units CAPS  Class 2 obesity without serious comorbidity with body mass index (  BMI) of 38.0 to 38.9 in adult, unspecified obesity type  PLAN:  Vitamin D Deficiency Jaclyn Lowe was informed that low vitamin D levels contributes to fatigue and are associated with obesity, breast, and colon cancer. She agrees to continue to take prescription Vit D @50 ,000 IU every week, we will refill for 1 month and will follow up for routine testing of vitamin D, at least 2-3 times per year. She was informed of the risk of over-replacement of vitamin D and agrees to not increase her dose unless he discusses this with [name] first.  Hypertension We discussed sodium restriction, working on healthy weight loss, and a regular exercise program as the means to achieve improved blood pressure control. Jaclyn Lowe agreed with this plan and agreed to follow up as directed. We will continue to monitor her blood pressure as well as her progress with the above lifestyle modifications. She will continue her medications as prescribed, we will refill Lisinopril for 1 month and will watch for signs of hypotension as she continues her lifestyle modifications.  Depression with Emotional Eating Behaviors We discussed behavior modification techniques today to help Jaclyn Lowe deal with her emotional eating and depression. She has agreed to start to take Topiramate 25 mg every night #30 with no refills and agreed to follow up as directed.  Obesity Jaclyn Lowe is currently in the action stage of change. As such, her goal is to continue with weight loss  efforts She has agreed to get back to strict Category 1 plan Jaclyn Lowe has been instructed to work up to a goal of 150 minutes of combined cardio and strengthening exercise per week for weight loss and overall health benefits. We discussed the following Behavioral Modification Stratagies today: increasing lean protein intake, decreasing sodium intake, emotional eating strategies and decrease junk food  Jaclyn Lowe has agreed to follow up with our clinic in 2 weeks. She was informed of the importance of frequent follow up visits to maximize her success with intensive lifestyle modifications for her multiple health conditions.  I, Doreene Nest, am acting as scribe for Dennard Nip, MD  I have reviewed the above documentation for accuracy and completeness, and I agree with the above. -Dennard Nip, MD

## 2016-06-03 ENCOUNTER — Other Ambulatory Visit (INDEPENDENT_AMBULATORY_CARE_PROVIDER_SITE_OTHER): Payer: Self-pay | Admitting: Family Medicine

## 2016-06-03 ENCOUNTER — Ambulatory Visit (INDEPENDENT_AMBULATORY_CARE_PROVIDER_SITE_OTHER): Payer: Managed Care, Other (non HMO) | Admitting: Family Medicine

## 2016-06-03 VITALS — BP 156/70 | HR 72 | Temp 97.7°F | Ht 63.0 in | Wt 214.0 lb

## 2016-06-03 DIAGNOSIS — E669 Obesity, unspecified: Secondary | ICD-10-CM | POA: Diagnosis not present

## 2016-06-03 DIAGNOSIS — R600 Localized edema: Secondary | ICD-10-CM | POA: Diagnosis not present

## 2016-06-03 DIAGNOSIS — E559 Vitamin D deficiency, unspecified: Secondary | ICD-10-CM

## 2016-06-03 DIAGNOSIS — Z6837 Body mass index (BMI) 37.0-37.9, adult: Secondary | ICD-10-CM

## 2016-06-03 DIAGNOSIS — F3289 Other specified depressive episodes: Secondary | ICD-10-CM

## 2016-06-03 MED ORDER — TOPIRAMATE 50 MG PO TABS: 50.0000 mg | ORAL_TABLET | Freq: Two times a day (BID) | ORAL | 0 refills | Status: DC

## 2016-06-03 NOTE — Progress Notes (Signed)
Office: [phone]  /  Fax: [phone]   HPI:   Chief Complaint: OBESITY Jaclyn Lowe is here to discuss her progress with her obesity treatment plan. She is following her eating plan approximately 80 % of the time and states she is walking 1 time per week. Jaclyn Lowe continues to do well with weight loss but is still struggling with excessive sweet cravings. She is getting bored with her plan and starting to deviate more. 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 Her weight is 214 lb (97.1 kg) today and has had a weight loss of 4 pounds over a period of 2 weeks since her last visit. She has gained 1 lb since starting treatment with [name].  Depression with emotional eating behaviors Jaclyn Lowe is struggling with emotional eating and using food for comfort to the extent that it is negatively impacting her health. She often snacks when she is not hungry. Jaclyn Lowe sometimes feels she is out of control and then feels guilty that she made poor food choices. Jaclyn Lowe started Topamax but not sure if it is helping with her emotional eating. She has been working on behavior modification techniques to help reduce her emotional eating and has been somewhat successful. She denies fatigue or dysgeusia. She shows no sign of suicidal or homicidal ideations.  Bilateral Lower Extremity Edema Jaclyn Lowe went off HCTZ due to leg cramps and decreased blood pressure but she notes increased bilateral lower extremity edema and wonders if she can take HCTZ occasionally.  Depression screen PHQ 2/9 04/21/2016  Decreased Interest 2  Down, Depressed, Hopeless 2  PHQ - 2 Score 4  Altered sleeping 1  Tired, decreased energy 3  Change in appetite 3  Feeling bad or failure about yourself  2  Trouble concentrating 2  Moving slowly or fidgety/restless 2  Suicidal thoughts 0  PHQ-9 Score 17     Wt Readings from Last 500 Encounters:  06/03/16 214 lb (97.1 kg)  05/20/16 218 lb (98.9 kg)  05/05/16 213 lb (96.6 kg)  04/21/16 222 lb (100.7 kg)      ALLERGIES: Allergies  Allergen Reactions  . Neosporin [Neomycin-Bacitracin Zn-Polymyx] Itching and Rash    MEDICATIONS: Current Outpatient Prescriptions on File Prior to Visit  Medication Sig Dispense Refill  . apixaban (ELIQUIS) 5 MG TABS tablet Take 5 mg by mouth 2 (two) times daily.    Marland Kitchen atorvastatin (LIPITOR) 10 MG tablet Take 10 mg by mouth daily.    . Cholecalciferol (VITAMIN D3) 50000 units CAPS Take 1 Dose by mouth once a week. 4 capsule 0  . lisinopril (PRINIVIL,ZESTRIL) 20 MG tablet Take 1 tablet (20 mg total) by mouth daily. 30 tablet 0   No current facility-administered medications on file prior to visit.     PAST MEDICAL HISTORY: Past Medical History:  Diagnosis Date  . A-fib ([place])   . Back pain   . Cancer ([place])    basal cell on the nose  . Edema    feet and legs  . Gallbladder disease   . HTN (hypertension)   . Hyperlipidemia   . Joint pain   . Loss of smell   . Loss of taste   . Palpitations   . Prediabetes   . Rectoceles   . SOB (shortness of breath)   . Swallowing difficulty   . Varicose vein of leg     PAST SURGICAL HISTORY: Past Surgical History:  Procedure Laterality Date  . ANKLE SURGERY     left ankle  . AV FISTULA  PLACEMENT     leg  . CHOLECYSTECTOMY    . GASTRIC BYPASS    . tummy tuck    . VEIN REPAIR      SOCIAL HISTORY: Social History  Substance Use Topics  . Smoking status: Former Research scientist (life sciences)  . Smokeless tobacco: Never Used  . Alcohol use Yes     Comment: occasional    FAMILY HISTORY: Family History  Problem Relation Age of Onset  . Diabetes Mother   . Hyperlipidemia Mother   . Sudden death Mother   . Obesity Mother   . Hypertension Father   . Hyperlipidemia Father   . Heart disease Father   . Sudden death Father     ROS: Review of Systems  Constitutional: Positive for weight loss. Negative for malaise/fatigue.       Negative dysgeusia  Cardiovascular: Positive for leg swelling (bilateral lower extremity  edema).  Psychiatric/Behavioral: Negative for suicidal ideas.    PHYSICAL EXAM: Blood pressure (!) 156/70, pulse 72, temperature 97.7 F (36.5 C), temperature source Oral, height 5\' 3"  (1.6 m), weight 214 lb (97.1 kg), SpO2 97 %. Body mass index is 37.91 kg/m. Physical Exam  Constitutional: She is oriented to person, place, and time. She appears well-developed and well-nourished.  Cardiovascular: Normal rate.   Pulmonary/Chest: Effort normal.  Musculoskeletal: Normal range of motion.  Neurological: She is oriented to person, place, and time.  Skin: Skin is warm and dry.  Psychiatric: She has a normal mood and affect. Her behavior is normal.  Vitals reviewed.   RECENT LABS AND TESTS: BMET    Component Value Date/Time   NA 142 04/21/2016 1157   K 3.9 04/21/2016 1157   CL 100 04/21/2016 1157   CO2 24 04/21/2016 1157   GLUCOSE 101 (H) 04/21/2016 1157   BUN 20 04/21/2016 1157   CREATININE 1.08 (H) 04/21/2016 1157   CALCIUM 9.0 04/21/2016 1157   GFRNONAA 52 (L) 04/21/2016 1157   GFRAA 60 04/21/2016 1157   Lab Results  Component Value Date   HGBA1C 5.8 (H) 04/21/2016   Lab Results  Component Value Date   INSULIN 8.3 04/21/2016   CBC    Component Value Date/Time   WBC 5.4 04/21/2016 1157   RBC 3.87 04/21/2016 1157   HCT 34.7 04/21/2016 1157   PLT 227 04/21/2016 1157   MCV 90 04/21/2016 1157   MCH 29.5 04/21/2016 1157   MCHC 32.9 04/21/2016 1157   RDW 14.2 04/21/2016 1157   LYMPHSABS 0.8 04/21/2016 1157   EOSABS 0.0 04/21/2016 1157   BASOSABS 0.0 04/21/2016 1157   Iron/TIBC/Ferritin/ %Sat No results found for: IRON, TIBC, FERRITIN, IRONPCTSAT Lipid Panel     Component Value Date/Time   CHOL 124 04/21/2016 1157   TRIG 104 04/21/2016 1157   HDL 42 04/21/2016 1157   LDLCALC 61 04/21/2016 1157   Hepatic Function Panel     Component Value Date/Time   PROT 6.5 04/21/2016 1157   ALBUMIN 4.0 04/21/2016 1157   AST 18 04/21/2016 1157   ALT 17 04/21/2016 1157    ALKPHOS 67 04/21/2016 1157   BILITOT 0.8 04/21/2016 1157      Component Value Date/Time   TSH 1.410 04/21/2016 1157    ASSESSMENT AND PLAN: Other depression - Plan: topiramate (TOPAMAX) 50 MG tablet  Lower extremity edema  Class 2 obesity without serious comorbidity with body mass index (BMI) of 37.0 to 37.9 in adult, unspecified obesity type  PLAN:  Depression with Emotional Eating Behaviors We discussed behavior  modification techniques today to help Jaclyn Lowe deal with her emotional eating and depression. She has agreed to increase Topamax to 50 mg every night #30 with no refills and agreed to follow up as directed.  Bilateral Lower Extremity Edema Jaclyn Lowe is okay to take HCTZ 12.5 mg prn, 1 to 2 times per week as long as she doesn't feel lightheaded or cramping again. Jaclyn Lowe agrees to follow up with our clinic in 3 weeks.  Obesity Jaclyn Lowe is currently in the action stage of change. As such, her goal is to continue with weight loss efforts She has agreed to keep a food journal with 300 to 400 calories and 25 grams of protein daily and follow the category 2 plan otherwise. Jaclyn Lowe has been instructed to work up to a goal of 150 minutes of combined cardio and strengthening exercise per week for weight loss and overall health benefits. We discussed the following Behavioral Modification Stratagies today: increasing lean protein intake, increasing lower sugar fruits and decreasing sodium intake  Jaclyn Lowe has agreed to follow up with our clinic in 3 weeks. She was informed of the importance of frequent follow up visits to maximize her success with intensive lifestyle modifications for her multiple health conditions.  I, Doreene Nest, am acting as scribe for Dennard Nip, MD  I have reviewed the above documentation for accuracy and completeness, and I agree with the above. -Dennard Nip, MD

## 2016-06-22 ENCOUNTER — Ambulatory Visit (INDEPENDENT_AMBULATORY_CARE_PROVIDER_SITE_OTHER): Payer: Managed Care, Other (non HMO) | Admitting: Family Medicine

## 2016-06-22 VITALS — BP 137/85 | HR 54 | Temp 98.1°F | Ht 63.0 in | Wt 210.0 lb

## 2016-06-22 DIAGNOSIS — F3289 Other specified depressive episodes: Secondary | ICD-10-CM | POA: Diagnosis not present

## 2016-06-22 DIAGNOSIS — I10 Essential (primary) hypertension: Secondary | ICD-10-CM | POA: Diagnosis not present

## 2016-06-22 DIAGNOSIS — E559 Vitamin D deficiency, unspecified: Secondary | ICD-10-CM | POA: Diagnosis not present

## 2016-06-22 DIAGNOSIS — E669 Obesity, unspecified: Secondary | ICD-10-CM

## 2016-06-22 DIAGNOSIS — Z6837 Body mass index (BMI) 37.0-37.9, adult: Secondary | ICD-10-CM

## 2016-06-22 DIAGNOSIS — IMO0001 Reserved for inherently not codable concepts without codable children: Secondary | ICD-10-CM

## 2016-06-22 MED ORDER — VITAMIN D3 1.25 MG (50000 UT) PO CAPS
1.0000 | ORAL_CAPSULE | ORAL | 0 refills | Status: DC
Start: 2016-06-22 — End: 2016-07-12

## 2016-06-22 MED ORDER — TOPIRAMATE 50 MG PO TABS: 50.0000 mg | ORAL_TABLET | Freq: Two times a day (BID) | ORAL | 0 refills | Status: DC

## 2016-06-22 MED ORDER — HYDROCHLOROTHIAZIDE 12.5 MG PO CAPS: 12.5000 mg | ORAL_CAPSULE | Freq: Every day | ORAL | 0 refills | Status: DC

## 2016-06-22 MED ORDER — LISINOPRIL 20 MG PO TABS: 20.0000 mg | ORAL_TABLET | Freq: Every day | ORAL | 0 refills | Status: DC

## 2016-06-22 NOTE — Progress Notes (Signed)
Office: [phone]  /  Fax: [phone]   HPI:   Chief Complaint: OBESITY Jaclyn Lowe is here to discuss her progress with her obesity treatment plan. She is following her eating plan approximately 80 % of the time and states she is exercising 0 minutes 0 times per week. Jaclyn Lowe has done better with weight loss in the last 2 weeks, had increased temptations and did indulge more than ideal but tried to portion control. She is not drinking soda anymore. She is not actually following the category 1 plan but is mostly working on increasing protein. She still struggles with planning meals ahead of time and eating out more than recommended. Her weight is 210 lb (95.3 kg) today and has had a weight loss of 4 pounds over a period of 2 to 3 weeks since her last visit. She has lost 12 lbs since starting treatment with [name].  Vitamin D deficiency Jaclyn Lowe has a diagnosis of vitamin D deficiency. She is currently stable on vit D and denies nausea, vomiting or muscle weakness.  Hypertension Jaclyn Lowe is a 71 y.o. female with hypertension. She notes feeling more lower extremity swelling off the HCTZ 25 mg and blood pressure has increased (we discontinued due to hypotension) Jaclyn Lowe denies chest pain or shortness of breath on exertion. She is working weight loss to help control her blood pressure with the goal of decreasing her risk of heart attack and stroke. [name] blood pressure is not currently controlled.  Depression with emotional eating behaviors Jaclyn Lowe is struggling with emotional eating and using food for comfort to the extent that it is negatively impacting her health. She often snacks when she is not hungry. Jaclyn Lowe sometimes feels she is out of control and then feels guilty that she made poor food choices. She is uncertain if Topamax is helping. She denies feeling "foggy", mood is good and is doing better with emotional eating. She has been working on behavior modification techniques to help reduce her  emotional eating and has been somewhat successful. She shows no sign of suicidal or homicidal ideations.  Depression screen PHQ 2/9 04/21/2016  Decreased Interest 2  Down, Depressed, Hopeless 2  PHQ - 2 Score 4  Altered sleeping 1  Tired, decreased energy 3  Change in appetite 3  Feeling bad or failure about yourself  2  Trouble concentrating 2  Moving slowly or fidgety/restless 2  Suicidal thoughts 0  PHQ-9 Score 17     Wt Readings from Last 500 Encounters:  06/22/16 210 lb (95.3 kg)  06/03/16 214 lb (97.1 kg)  05/20/16 218 lb (98.9 kg)  05/05/16 213 lb (96.6 kg)  04/21/16 222 lb (100.7 kg)     ALLERGIES: Allergies  Allergen Reactions  . Neosporin [Neomycin-Bacitracin Zn-Polymyx] Itching and Rash    MEDICATIONS: Current Outpatient Prescriptions on File Prior to Visit  Medication Sig Dispense Refill  . apixaban (ELIQUIS) 5 MG TABS tablet Take 5 mg by mouth 2 (two) times daily.    Marland Kitchen atorvastatin (LIPITOR) 10 MG tablet Take 10 mg by mouth daily.    . Cholecalciferol (VITAMIN D3) 50000 units CAPS Take 1 Dose by mouth once a week. 4 capsule 0  . topiramate (TOPAMAX) 50 MG tablet Take 1 tablet (50 mg total) by mouth 2 (two) times daily. 30 tablet 0   No current facility-administered medications on file prior to visit.     PAST MEDICAL HISTORY: Past Medical History:  Diagnosis Date  . A-fib ([place])   . Back pain   .  Cancer ([place])    basal cell on the nose  . Edema    feet and legs  . Gallbladder disease   . HTN (hypertension)   . Hyperlipidemia   . Joint pain   . Loss of smell   . Loss of taste   . Palpitations   . Prediabetes   . Rectoceles   . SOB (shortness of breath)   . Swallowing difficulty   . Varicose vein of leg     PAST SURGICAL HISTORY: Past Surgical History:  Procedure Laterality Date  . ANKLE SURGERY     left ankle  . AV FISTULA PLACEMENT     leg  . CHOLECYSTECTOMY    . GASTRIC BYPASS    . tummy tuck    . VEIN REPAIR      SOCIAL  HISTORY: Social History  Substance Use Topics  . Smoking status: Former Research scientist (life sciences)  . Smokeless tobacco: Never Used  . Alcohol use Yes     Comment: occasional    FAMILY HISTORY: Family History  Problem Relation Age of Onset  . Diabetes Mother   . Hyperlipidemia Mother   . Sudden death Mother   . Obesity Mother   . Hypertension Father   . Hyperlipidemia Father   . Heart disease Father   . Sudden death Father     ROS: Review of Systems  Constitutional: Positive for weight loss.  Respiratory: Negative for shortness of breath (on exertion).   Cardiovascular: Negative for chest pain.  Gastrointestinal: Negative for nausea and vomiting.  Musculoskeletal:       Negative muscle weakness  Psychiatric/Behavioral: Positive for depression. Negative for suicidal ideas.    PHYSICAL EXAM: Blood pressure 137/85, pulse (!) 54, temperature 98.1 F (36.7 C), temperature source Oral, height 5\' 3"  (1.6 m), weight 210 lb (95.3 kg), SpO2 99 %. Body mass index is 37.2 kg/m. Physical Exam  Constitutional: She is oriented to person, place, and time. She appears well-developed and well-nourished.  Cardiovascular: Normal rate.   Pulmonary/Chest: Effort normal.  Musculoskeletal: Normal range of motion.  Neurological: She is oriented to person, place, and time.  Skin: Skin is warm and dry.  Vitals reviewed.   RECENT LABS AND TESTS: BMET    Component Value Date/Time   NA 142 04/21/2016 1157   K 3.9 04/21/2016 1157   CL 100 04/21/2016 1157   CO2 24 04/21/2016 1157   GLUCOSE 101 (H) 04/21/2016 1157   BUN 20 04/21/2016 1157   CREATININE 1.08 (H) 04/21/2016 1157   CALCIUM 9.0 04/21/2016 1157   GFRNONAA 52 (L) 04/21/2016 1157   GFRAA 60 04/21/2016 1157   Lab Results  Component Value Date   HGBA1C 5.8 (H) 04/21/2016   Lab Results  Component Value Date   INSULIN 8.3 04/21/2016   CBC    Component Value Date/Time   WBC 5.4 04/21/2016 1157   RBC 3.87 04/21/2016 1157   HCT 34.7  04/21/2016 1157   PLT 227 04/21/2016 1157   MCV 90 04/21/2016 1157   MCH 29.5 04/21/2016 1157   MCHC 32.9 04/21/2016 1157   RDW 14.2 04/21/2016 1157   LYMPHSABS 0.8 04/21/2016 1157   EOSABS 0.0 04/21/2016 1157   BASOSABS 0.0 04/21/2016 1157   Iron/TIBC/Ferritin/ %Sat No results found for: IRON, TIBC, FERRITIN, IRONPCTSAT Lipid Panel     Component Value Date/Time   CHOL 124 04/21/2016 1157   TRIG 104 04/21/2016 1157   HDL 42 04/21/2016 1157   LDLCALC 61 04/21/2016 1157  Hepatic Function Panel     Component Value Date/Time   PROT 6.5 04/21/2016 1157   ALBUMIN 4.0 04/21/2016 1157   AST 18 04/21/2016 1157   ALT 17 04/21/2016 1157   ALKPHOS 67 04/21/2016 1157   BILITOT 0.8 04/21/2016 1157      Component Value Date/Time   TSH 1.410 04/21/2016 1157    ASSESSMENT AND PLAN: Essential hypertension - Plan: lisinopril (PRINIVIL,ZESTRIL) 20 MG tablet, hydrochlorothiazide (MICROZIDE) 12.5 MG capsule  Vitamin D deficiency - Plan: Cholecalciferol (VITAMIN D3) 50000 units CAPS  Other depression - Plan: topiramate (TOPAMAX) 50 MG tablet  Class 2 obesity with serious comorbidity and body mass index (BMI) of 37.0 to 37.9 in adult, unspecified obesity type  PLAN:  Vitamin D Deficiency Labrea was informed that low vitamin D levels contributes to fatigue and are associated with obesity, breast, and colon cancer. She agrees to continue to take prescription Vit D @50 ,000 IU every week, we will refill for 1 month and will follow up for routine testing of vitamin D, at least 2-3 times per year. She was informed of the risk of over-replacement of vitamin D and agrees to not increase her dose unless he discusses this with [name] first. Lonie agrees to follow up with our clinic in 2 to 3 weeks.  Hypertension We discussed sodium restriction, working on healthy weight loss, and a regular exercise program as the means to achieve improved blood pressure control. Daphnie agreed with this plan and agreed to  follow up as directed. We will continue to monitor her blood pressure as well as her progress with the above lifestyle modifications. She agrees to continue Lisinopril, we will refill for 1 month and start to take HCTZ 12.5 mg qd #30 with no refills and will watch for signs of hypotension as she continues her lifestyle modifications. Zahira agreed to follow up with our clinic in 2 to 3 weeks.  Depression with Emotional Eating Behaviors We discussed behavior modification techniques today to help Aleece deal with her emotional eating and depression. She has agreed to continue to take Topamax 50 mg, we will refill for 1 month and agreed to follow up as directed. She agrees to increase H2O to at least 64 ounces daily.  Obesity Porshe is currently in the action stage of change. As such, her goal is to continue with weight loss efforts She has agreed to follow the Category 1 plan Jaslin has been instructed to work up to a goal of 150 minutes of combined cardio and strengthening exercise per week for weight loss and overall health benefits. We discussed the following Behavioral Modification Stratagies today: increasing lean protein intake  Aubreyana has agreed to follow up with our clinic in 2 to 3 weeks. She was informed of the importance of frequent follow up visits to maximize her success with intensive lifestyle modifications for her multiple health conditions.  I, Doreene Nest, am acting as scribe for Dennard Nip, MD  I have reviewed the above documentation for accuracy and completeness, and I agree with the above. -Dennard Nip, MD

## 2016-07-08 ENCOUNTER — Other Ambulatory Visit (INDEPENDENT_AMBULATORY_CARE_PROVIDER_SITE_OTHER): Payer: Self-pay | Admitting: Family Medicine

## 2016-07-08 DIAGNOSIS — I10 Essential (primary) hypertension: Secondary | ICD-10-CM

## 2016-07-08 DIAGNOSIS — E559 Vitamin D deficiency, unspecified: Secondary | ICD-10-CM

## 2016-07-12 ENCOUNTER — Ambulatory Visit (INDEPENDENT_AMBULATORY_CARE_PROVIDER_SITE_OTHER): Payer: Managed Care, Other (non HMO) | Admitting: Family Medicine

## 2016-07-12 VITALS — BP 146/74 | HR 56 | Temp 98.4°F | Ht 63.0 in | Wt 205.0 lb

## 2016-07-12 DIAGNOSIS — E669 Obesity, unspecified: Secondary | ICD-10-CM

## 2016-07-12 DIAGNOSIS — Z6833 Body mass index (BMI) 33.0-33.9, adult: Secondary | ICD-10-CM

## 2016-07-12 DIAGNOSIS — I10 Essential (primary) hypertension: Secondary | ICD-10-CM | POA: Diagnosis not present

## 2016-07-12 DIAGNOSIS — F3289 Other specified depressive episodes: Secondary | ICD-10-CM

## 2016-07-12 DIAGNOSIS — E559 Vitamin D deficiency, unspecified: Secondary | ICD-10-CM | POA: Diagnosis not present

## 2016-07-12 DIAGNOSIS — Z6836 Body mass index (BMI) 36.0-36.9, adult: Secondary | ICD-10-CM | POA: Diagnosis not present

## 2016-07-12 MED ORDER — HYDROCHLOROTHIAZIDE 12.5 MG PO CAPS: 12.5000 mg | ORAL_CAPSULE | Freq: Every day | ORAL | 0 refills | Status: DC

## 2016-07-12 MED ORDER — TOPIRAMATE 50 MG PO TABS: 50.0000 mg | ORAL_TABLET | Freq: Two times a day (BID) | ORAL | 0 refills | Status: DC

## 2016-07-12 MED ORDER — VITAMIN D3 1.25 MG (50000 UT) PO CAPS: 1.0000 | ORAL_CAPSULE | ORAL | 0 refills | Status: DC

## 2016-07-13 ENCOUNTER — Telehealth (INDEPENDENT_AMBULATORY_CARE_PROVIDER_SITE_OTHER): Payer: Self-pay

## 2016-07-13 NOTE — Progress Notes (Signed)
Office: [phone]  /  Fax: [phone]   HPI:   Chief Complaint: OBESITY Jaclyn Lowe is here to discuss her progress with her obesity treatment plan. She is on the  follow the Category 1 plan and is following her eating plan approximately 90 % of the time. She states she is exercising 0 minutes 0 times per week. Jaclyn Lowe continues to do well with weight loss. She went on vacation but still did well. Her weight is 205 lb (93 kg) today and has had a weight loss of 5 pounds over a period of 3 weeks since her last visit. She has lost 17 lbs since starting treatment with [name].  Vitamin D deficiency Jaclyn Lowe has a diagnosis of vitamin D deficiency. She is currently stable on vit D and denies nausea, vomiting or muscle weakness.  Hypertension Jaclyn Lowe is a 71 y.o. female with hypertension. Her blood pressure is elevated today, off HCTZ today.  Jaclyn Lowe denies chest pain, headache or shortness of breath on exertion. She is working weight loss to help control her blood pressure with the goal of decreasing her risk of heart attack and stroke. Jaclyn Lowe blood pressure is not currently controlled.  Depression with emotional eating behaviors Jaclyn Lowe started Topamax and feels she is doing better with emotional eating. She is on Topamax 2 times per day and she seems to be having more word searching but doesn't want to decrease dose. Jaclyn Lowe struggles with emotional eating and using food for comfort to the extent that it is negatively impacting her health. She often snacks when she is not hungry. Jaclyn Lowe sometimes feels she is out of control and then feels guilty that she made poor food choices. She has been working on behavior modification techniques to help reduce her emotional eating and has been somewhat successful. She shows no sign of suicidal or homicidal ideations.  Depression screen PHQ 2/9 04/21/2016  Decreased Interest 2  Down, Depressed, Hopeless 2  PHQ - 2 Score 4  Altered sleeping 1  Tired, decreased energy 3    Change in appetite 3  Feeling bad or failure about yourself  2  Trouble concentrating 2  Moving slowly or fidgety/restless 2  Suicidal thoughts 0  PHQ-9 Score 17      ALLERGIES: Allergies  Allergen Reactions  . Neosporin [Neomycin-Bacitracin Zn-Polymyx] Itching and Rash    MEDICATIONS: Current Outpatient Prescriptions on File Prior to Visit  Medication Sig Dispense Refill  . apixaban (ELIQUIS) 5 MG TABS tablet Take 5 mg by mouth 2 (two) times daily.    Marland Kitchen atorvastatin (LIPITOR) 10 MG tablet Take 10 mg by mouth daily.    Marland Kitchen lisinopril (PRINIVIL,ZESTRIL) 20 MG tablet Take 1 tablet (20 mg total) by mouth daily. 30 tablet 0   No current facility-administered medications on file prior to visit.     PAST MEDICAL HISTORY: Past Medical History:  Diagnosis Date  . A-fib ([place])   . Back pain   . Cancer ([place])    basal cell on the nose  . Edema    feet and legs  . Gallbladder disease   . HTN (hypertension)   . Hyperlipidemia   . Joint pain   . Loss of smell   . Loss of taste   . Palpitations   . Prediabetes   . Rectoceles   . SOB (shortness of breath)   . Swallowing difficulty   . Varicose vein of leg     PAST SURGICAL HISTORY: Past Surgical History:  Procedure Laterality Date  .  ANKLE SURGERY     left ankle  . AV FISTULA PLACEMENT     leg  . CHOLECYSTECTOMY    . GASTRIC BYPASS    . tummy tuck    . VEIN REPAIR      SOCIAL HISTORY: Social History  Substance Use Topics  . Smoking status: Former Research scientist (life sciences)  . Smokeless tobacco: Never Used  . Alcohol use Yes     Comment: occasional    FAMILY HISTORY: Family History  Problem Relation Age of Onset  . Diabetes Mother   . Hyperlipidemia Mother   . Sudden death Mother   . Obesity Mother   . Hypertension Father   . Hyperlipidemia Father   . Heart disease Father   . Sudden death Father     ROS: Review of Systems  Constitutional: Positive for malaise/fatigue and weight loss.  Respiratory: Negative for  shortness of breath (on exertion).   Cardiovascular: Negative for chest pain.  Gastrointestinal: Negative for nausea and vomiting.  Musculoskeletal:       Negative muscle weakness  Neurological: Negative for headaches.  Psychiatric/Behavioral: Positive for depression. Negative for suicidal ideas.    PHYSICAL EXAM: Blood pressure (!) 146/74, pulse (!) 56, temperature 98.4 F (36.9 C), temperature source Oral, height 5\' 3"  (1.6 m), weight 205 lb (93 kg), SpO2 98 %. Body mass index is 36.31 kg/m. Physical Exam  Constitutional: She is oriented to person, place, and time. She appears well-developed and well-nourished.  Cardiovascular: Normal rate.   Pulmonary/Chest: Effort normal.  Musculoskeletal: Normal range of motion.  Neurological: She is oriented to person, place, and time.  Skin: Skin is warm and dry.  Psychiatric: She has a normal mood and affect. Her behavior is normal.  Vitals reviewed.   RECENT LABS AND TESTS: BMET    Component Value Date/Time   NA 142 04/21/2016 1157   K 3.9 04/21/2016 1157   CL 100 04/21/2016 1157   CO2 24 04/21/2016 1157   GLUCOSE 101 (H) 04/21/2016 1157   BUN 20 04/21/2016 1157   CREATININE 1.08 (H) 04/21/2016 1157   CALCIUM 9.0 04/21/2016 1157   GFRNONAA 52 (L) 04/21/2016 1157   GFRAA 60 04/21/2016 1157   Lab Results  Component Value Date   HGBA1C 5.8 (H) 04/21/2016   Lab Results  Component Value Date   INSULIN 8.3 04/21/2016   CBC    Component Value Date/Time   WBC 5.4 04/21/2016 1157   RBC 3.87 04/21/2016 1157   HCT 34.7 04/21/2016 1157   PLT 227 04/21/2016 1157   MCV 90 04/21/2016 1157   MCH 29.5 04/21/2016 1157   MCHC 32.9 04/21/2016 1157   RDW 14.2 04/21/2016 1157   LYMPHSABS 0.8 04/21/2016 1157   EOSABS 0.0 04/21/2016 1157   BASOSABS 0.0 04/21/2016 1157   Iron/TIBC/Ferritin/ %Sat No results found for: IRON, TIBC, FERRITIN, IRONPCTSAT Lipid Panel     Component Value Date/Time   CHOL 124 04/21/2016 1157   TRIG 104  04/21/2016 1157   HDL 42 04/21/2016 1157   LDLCALC 61 04/21/2016 1157   Hepatic Function Panel     Component Value Date/Time   PROT 6.5 04/21/2016 1157   ALBUMIN 4.0 04/21/2016 1157   AST 18 04/21/2016 1157   ALT 17 04/21/2016 1157   ALKPHOS 67 04/21/2016 1157   BILITOT 0.8 04/21/2016 1157      Component Value Date/Time   TSH 1.410 04/21/2016 1157    ASSESSMENT AND PLAN: Other depression - Plan: topiramate (TOPAMAX) 50 MG tablet  Vitamin D deficiency - Plan: Cholecalciferol (VITAMIN D3) 50000 units CAPS  Essential hypertension - Plan: hydrochlorothiazide (MICROZIDE) 12.5 MG capsule  Class 2 obesity without serious comorbidity with body mass index (BMI) of 36.0 to 36.9 in adult, unspecified obesity type  PLAN:  Vitamin D Deficiency Jaclyn Lowe was informed that low vitamin D levels contributes to fatigue and are associated with obesity, breast, and colon cancer. She agrees to continue to take prescription Vit D @50 ,000 IU every week, we will refill for 1 month and will follow up for routine testing of vitamin D, at least 2-3 times per year. She was informed of the risk of over-replacement of vitamin D and agrees to not increase her dose unless he discusses this with [name] first. Jaclyn Lowe agrees to follow up with our clinic in 2 weeks.  Hypertension We discussed sodium restriction, working on healthy weight loss, and a regular exercise program as the means to achieve improved blood pressure control. Jaclyn Lowe agreed with this plan and agreed to follow up as directed. We will continue to monitor her blood pressure as well as her progress with the above lifestyle modifications. She agrees to continue HCTZ 12.5 mg qd #30 with no refills and will continue her Lisinopril as prescribed and will watch for signs of hypotension as she continues her lifestyle modifications.  Depression with Emotional Eating Behaviors We discussed behavior modification techniques today to help Jaclyn Lowe deal with her emotional  eating and depression. She has agreed to continue to take Topamax 50 mg bid, we will refill for 1 month and she agreed to follow up as directed.  Obesity Jaclyn Lowe is currently in the action stage of change. As such, her goal is to continue with weight loss efforts She has agreed to follow the Category 1 plan Jaclyn Lowe has been instructed to work up to a goal of 150 minutes of combined cardio and strengthening exercise per week for weight loss and overall health benefits. We discussed the following Behavioral Modification Stratagies today: increasing lean protein intake and decreasing simple carbohydrates   Jaclyn Lowe has agreed to follow up with our clinic in 3 weeks. She was informed of the importance of frequent follow up visits to maximize her success with intensive lifestyle modifications for her multiple health conditions.  I, Doreene Nest, am acting as scribe for Dennard Nip, MD  I have reviewed the above documentation for accuracy and completeness, and I agree with the above. -Dennard Nip, MD

## 2016-07-14 ENCOUNTER — Other Ambulatory Visit: Payer: Self-pay | Admitting: Physician Assistant

## 2016-07-14 ENCOUNTER — Ambulatory Visit (INDEPENDENT_AMBULATORY_CARE_PROVIDER_SITE_OTHER): Payer: Managed Care, Other (non HMO) | Admitting: Physician Assistant

## 2016-07-14 VITALS — BP 110/70 | HR 78 | Ht 63.0 in | Wt 205.0 lb

## 2016-07-14 DIAGNOSIS — L2084 Intrinsic (allergic) eczema: Secondary | ICD-10-CM

## 2016-07-14 DIAGNOSIS — L03119 Cellulitis of unspecified part of limb: Secondary | ICD-10-CM | POA: Diagnosis not present

## 2016-07-14 MED ORDER — TRIAMCINOLONE ACETONIDE 0.5 % EX CREA: 1.0000 "application " | TOPICAL_CREAM | Freq: Three times a day (TID) | CUTANEOUS | 6 refills | Status: DC

## 2016-07-14 MED ORDER — CEPHALEXIN 500 MG PO CAPS: 500.0000 mg | ORAL_CAPSULE | Freq: Four times a day (QID) | ORAL | 1 refills | Status: DC

## 2016-07-14 MED ORDER — TRIAMCINOLONE ACETONIDE 0.5 % EX CREA: 1.0000 "application " | TOPICAL_CREAM | Freq: Three times a day (TID) | CUTANEOUS | 0 refills | Status: DC

## 2016-07-14 NOTE — Patient Instructions (Signed)
Cellulitis, Adult Cellulitis is a skin infection. The infected area is usually red and sore. This condition occurs most often in the arms and lower legs. It is very important to get treated for this condition. Follow these instructions at home:  Take over-the-counter and prescription medicines only as told by your doctor.  If you were prescribed an antibiotic medicine, take it as told by your doctor. Do not stop taking the antibiotic even if you start to feel better.  Drink enough fluid to keep your pee (urine) clear or pale yellow.  Do not touch or rub the infected area.  Raise (elevate) the infected area above the level of your heart while you are sitting or lying down.  Place warm or cold wet cloths (warm or cold compresses) on the infected area. Do this as told by your doctor.  Keep all follow-up visits as told by your doctor. This is important. These visits let your doctor make sure your infection is not getting worse. Contact a doctor if:  You have a fever.  Your symptoms do not get better after 1-2 days of treatment.  Your bone or joint under the infected area starts to hurt after the skin has healed.  Your infection comes back. This can happen in the same area or another area.  You have a swollen bump in the infected area.  You have new symptoms.  You feel ill and also have muscle aches and pains. Get help right away if:  Your symptoms get worse.  You feel very sleepy.  You throw up (vomit) or have watery poop (diarrhea) for a long time.  There are red streaks coming from the infected area.  Your red area gets larger.  Your red area turns darker. This information is not intended to replace advice given to you by your health care provider. Make sure you discuss any questions you have with your health care provider. Document Released: 08/04/2007 Document Revised: 07/24/2015 Document Reviewed: 12/25/2014 Elsevier Interactive Patient Education  2017 [employer]  

## 2016-07-15 ENCOUNTER — Encounter: Payer: Self-pay | Admitting: Physician Assistant

## 2016-07-15 NOTE — Progress Notes (Signed)
BP 110/70   Pulse 78   Ht 5\' 3"  (1.6 m)   Wt 205 lb (93 kg)   BMI 36.31 kg/m    Subjective:    Patient ID: Jaclyn Lowe, female    DOB: 09-12-1945, 71 y.o.   MRN: [id number]  HPI: Jaclyn Lowe is a 71 y.o. female presenting on 07/14/2016 for No chief complaint on file. This patient has had long-standing issues with eczema. At times it gets so flared up and she is scratching that she will have surrounding cellulitis. She has some swelling in from the knees to the ankles associated with the eczema areas. She has not been using any type of steroid topically. She has used a minimal amount of over-the-counter antibiotic cream.  Relevant past medical, surgical, family and social history reviewed and updated as indicated. Allergies and medications reviewed and updated.  Past Medical History:  Diagnosis Date  . A-fib ([place])   . Back pain   . Cancer ([place])    basal cell on the nose  . Edema    feet and legs  . Gallbladder disease   . HTN (hypertension)   . Hyperlipidemia   . Joint pain   . Loss of smell   . Loss of taste   . Palpitations   . Prediabetes   . Rectoceles   . SOB (shortness of breath)   . Swallowing difficulty   . Varicose vein of leg     Past Surgical History:  Procedure Laterality Date  . ANKLE SURGERY     left ankle  . AV FISTULA PLACEMENT     leg  . CHOLECYSTECTOMY    . GASTRIC BYPASS    . tummy tuck    . VEIN REPAIR      Review of Systems  Constitutional: Negative.  Negative for activity change, fatigue and fever.  HENT: Negative.   Eyes: Negative.   Respiratory: Negative.  Negative for cough.   Cardiovascular: Negative.  Negative for chest pain.  Gastrointestinal: Negative.  Negative for abdominal pain.  Endocrine: Negative.   Genitourinary: Negative.  Negative for dysuria.  Musculoskeletal: Negative.   Skin: Positive for color change and rash.  Neurological: Negative.     Allergies as of 07/14/2016      Reactions   Neosporin  [neomycin-bacitracin Zn-polymyx] Itching, Rash      Medication List       Accurate as of 07/14/16 11:59 PM. Always use your most recent med list.          atorvastatin 10 MG tablet Commonly known as:  LIPITOR Take 10 mg by mouth daily.   cephALEXin 500 MG capsule Commonly known as:  KEFLEX Take 1 capsule (500 mg total) by mouth 4 (four) times daily.   cephALEXin 500 MG capsule Commonly known as:  KEFLEX Take 1 capsule (500 mg total) by mouth 4 (four) times daily.   ELIQUIS 5 MG Tabs tablet Generic drug:  apixaban Take 5 mg by mouth 2 (two) times daily.   hydrochlorothiazide 12.5 MG capsule Commonly known as:  MICROZIDE Take 1 capsule (12.5 mg total) by mouth daily.   lisinopril 20 MG tablet Commonly known as:  PRINIVIL,ZESTRIL Take 1 tablet (20 mg total) by mouth daily.   topiramate 50 MG tablet Commonly known as:  TOPAMAX Take 1 tablet (50 mg total) by mouth 2 (two) times daily.   triamcinolone cream 0.5 % Commonly known as:  KENALOG Apply 1 application topically 3 (three) times daily.   Vitamin D3  50000 units Caps Take 1 Dose by mouth once a week.          Objective:    BP 110/70   Pulse 78   Ht 5\' 3"  (1.6 m)   Wt 205 lb (93 kg)   BMI 36.31 kg/m   Allergies  Allergen Reactions  . Neosporin [Neomycin-Bacitracin Zn-Polymyx] Itching and Rash    Physical Exam  Constitutional: She is oriented to person, place, and time. She appears well-developed and well-nourished.  HENT:  Head: Normocephalic and atraumatic.  Right Ear: Tympanic membrane, external ear and ear canal normal.  Left Ear: Tympanic membrane, external ear and ear canal normal.  Nose: Nose normal. No rhinorrhea.  Mouth/Throat: Oropharynx is clear and moist and mucous membranes are normal. No oropharyngeal exudate or posterior oropharyngeal erythema.  Eyes: Conjunctivae and EOM are normal. Pupils are equal, round, and reactive to light.  Neck: Normal range of motion. Neck supple.    Cardiovascular: Normal rate, regular rhythm, normal heart sounds and intact distal pulses.   Pulmonary/Chest: Effort normal and breath sounds normal.  Abdominal: Soft. Bowel sounds are normal.  Neurological: She is alert and oriented to person, place, and time. She has normal reflexes.  Skin: Skin is warm and dry. Rash noted. There is erythema.  Psychiatric: She has a normal mood and affect. Her behavior is normal. Judgment and thought content normal.  Nursing note and vitals reviewed.       Assessment & Plan:   1. Intrinsic eczema - triamcinolone cream (KENALOG) 0.5 %; Apply 1 application topically 3 (three) times daily.  Dispense: 60 g; Refill: 6  2. Cellulitis of lower extremity, unspecified laterality - cephALEXin (KEFLEX) 500 MG capsule; Take 1 capsule (500 mg total) by mouth 4 (four) times daily.  Dispense: 40 capsule; Refill: 1   Current Outpatient Prescriptions:  .  apixaban (ELIQUIS) 5 MG TABS tablet, Take 5 mg by mouth 2 (two) times daily., Disp: , Rfl:  .  atorvastatin (LIPITOR) 10 MG tablet, Take 10 mg by mouth daily., Disp: , Rfl:  .  cephALEXin (KEFLEX) 500 MG capsule, Take 1 capsule (500 mg total) by mouth 4 (four) times daily., Disp: 40 capsule, Rfl: 1 .  Cholecalciferol (VITAMIN D3) 50000 units CAPS, Take 1 Dose by mouth once a week., Disp: 4 capsule, Rfl: 0 .  hydrochlorothiazide (MICROZIDE) 12.5 MG capsule, Take 1 capsule (12.5 mg total) by mouth daily., Disp: 30 capsule, Rfl: 0 .  lisinopril (PRINIVIL,ZESTRIL) 20 MG tablet, Take 1 tablet (20 mg total) by mouth daily., Disp: 30 tablet, Rfl: 0 .  topiramate (TOPAMAX) 50 MG tablet, Take 1 tablet (50 mg total) by mouth 2 (two) times daily., Disp: 30 tablet, Rfl: 0 .  cephALEXin (KEFLEX) 500 MG capsule, Take 1 capsule (500 mg total) by mouth 4 (four) times daily., Disp: 40 capsule, Rfl: 1 .  triamcinolone cream (KENALOG) 0.5 %, Apply 1 application topically 3 (three) times daily., Disp: 60 g, Rfl: 6  Continue all other  maintenance medications as listed above.  Follow up plan: Return if symptoms worsen or fail to improve.  Educational handout given for [place] PA-C [place] [address]  [place], [place] [address] [phone]   07/15/2016, 3:14 PM

## 2016-07-17 ENCOUNTER — Other Ambulatory Visit (INDEPENDENT_AMBULATORY_CARE_PROVIDER_SITE_OTHER): Payer: Self-pay | Admitting: Family Medicine

## 2016-07-17 DIAGNOSIS — I10 Essential (primary) hypertension: Secondary | ICD-10-CM

## 2016-07-29 NOTE — Telephone Encounter (Signed)
07/13/2016  -  Spoke with pharmacy tech and asked her to increase dosage from qty 30 to qty 60. Patient is to take 2 times a day.   Cleaning up in basket

## 2016-08-02 ENCOUNTER — Ambulatory Visit (INDEPENDENT_AMBULATORY_CARE_PROVIDER_SITE_OTHER): Payer: Managed Care, Other (non HMO) | Admitting: Family Medicine

## 2016-08-02 ENCOUNTER — Encounter (INDEPENDENT_AMBULATORY_CARE_PROVIDER_SITE_OTHER): Payer: Self-pay

## 2016-08-03 ENCOUNTER — Ambulatory Visit (INDEPENDENT_AMBULATORY_CARE_PROVIDER_SITE_OTHER): Payer: Managed Care, Other (non HMO) | Admitting: Neurology

## 2016-08-03 ENCOUNTER — Ambulatory Visit (INDEPENDENT_AMBULATORY_CARE_PROVIDER_SITE_OTHER): Payer: Managed Care, Other (non HMO) | Admitting: Family Medicine

## 2016-08-03 ENCOUNTER — Encounter: Payer: Self-pay | Admitting: Neurology

## 2016-08-03 VITALS — BP 163/77 | HR 70 | Ht 63.0 in | Wt 199.4 lb

## 2016-08-03 DIAGNOSIS — Z6835 Body mass index (BMI) 35.0-35.9, adult: Secondary | ICD-10-CM | POA: Diagnosis not present

## 2016-08-03 DIAGNOSIS — IMO0001 Reserved for inherently not codable concepts without codable children: Secondary | ICD-10-CM

## 2016-08-03 DIAGNOSIS — E538 Deficiency of other specified B group vitamins: Secondary | ICD-10-CM | POA: Diagnosis not present

## 2016-08-03 DIAGNOSIS — E669 Obesity, unspecified: Secondary | ICD-10-CM | POA: Diagnosis not present

## 2016-08-03 DIAGNOSIS — R410 Disorientation, unspecified: Secondary | ICD-10-CM

## 2016-08-03 NOTE — Care Management Note (Signed)
Jaclyn Lowe (MRN [id number]) is a 71 yo female who was self referred to our clinic, [facility], on 04/21/16, for evaluation and treatment on obesity. She has been following up with [name] every 2 weeks and has always been mentally alert and oriented. She had a follow up on 08/02/16 but got lost twice and was rescheduled for today, 08/03/16 but got lost again. We advised that she pulls up to a near parking lot, and 2 staff members went and brought her to the clinic.  I, Lacy Duverney, PAC, spoke to Donaciano Eva, a Librarian, academic over at [facility] and relayed the above information. We both have agreed that the patient should go to the Emergency Department for clinical evaluation of her acute Delirium and must go to the Emergency Department to rule out acute infection and have workup for stroke.

## 2016-08-03 NOTE — Patient Instructions (Addendum)
Remember to drink plenty of fluid, eat healthy meals and do not skip any meals. Try to eat protein with a every meal and eat a healthy snack such as fruit or nuts in between meals. Try to keep a regular sleep-wake schedule and try to exercise daily, particularly in the form of walking, 20-30 minutes a day, if you can.   As far as your medications are concerned, I would like to suggest: Continue current medications  As far as diagnostic testing: Labs  I would like to see you back as needed, sooner if we need to. Please call us with any interim questions, concerns, problems, updates or refill requests.   Our phone number is [phone]. We also have an after hours call service for urgent matters and there is a physician on-call for urgent questions. For any emergencies you know to call 911 or go to the nearest emergency room

## 2016-08-03 NOTE — Progress Notes (Addendum)
Office: [phone]  /  Fax: [phone]   HPI:    Chief Complaint:  Pt has new onset confusion about coming to our clinic yesterday and got lost twice. She was confused today and also got lost again. She was relying on her GPS and became somewhat disoriented. She was highly emotional about this disorientation and alternated between becoming angry and crying both times yesterday and again today. We convinced her to pull into a parking lot and sent 2 staff member to meet her and help guide her to our office. She states she was mostly angry at herself and after much discussion admitted to more memory loss recently. 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 She has been on low dose topiramate to help with emotional eating and this may be affecting her memory.   OBESITY Jaclyn Lowe is here to discuss her progress with her obesity treatment plan. She is on the  portion control better and make smarter food choices, such as increase vegetables and decrease simple carbohydrates  and is following her eating plan approximately 80 % of the time. She states she is exercising 0 minutes 0 times per week. Her weight is   today and has had a weight loss of 6 pounds over a period of 2 weeks since her last visit.   ALLERGIES: Allergies  Allergen Reactions  . Neosporin [Neomycin-Bacitracin Zn-Polymyx] Itching and Rash    MEDICATIONS: Current Outpatient Prescriptions on File Prior to Visit  Medication Sig Dispense Refill  . apixaban (ELIQUIS) 5 MG TABS tablet Take 5 mg by mouth 2 (two) times daily.    Marland Kitchen atorvastatin (LIPITOR) 10 MG tablet Take 10 mg by mouth daily.    . cephALEXin (KEFLEX) 500 MG capsule Take 1 capsule (500 mg total) by mouth 4 (four) times daily. 40 capsule 1  . cephALEXin (KEFLEX) 500 MG capsule Take 1 capsule (500 mg total) by mouth 4 (four) times daily. 40 capsule 1  . Cholecalciferol (VITAMIN D3) 50000 units  CAPS Take 1 Dose by mouth once a week. 4 capsule 0  . hydrochlorothiazide (MICROZIDE) 12.5 MG capsule Take 1 capsule (12.5 mg total) by mouth daily. 30 capsule 0  . lisinopril (PRINIVIL,ZESTRIL) 20 MG tablet TAKE 1 TABLET BY MOUTH ONCE DAILY 30 tablet 0  . topiramate (TOPAMAX) 50 MG tablet Take 1 tablet (50 mg total) by mouth 2 (two) times daily. 30 tablet 0  . triamcinolone cream (KENALOG) 0.5 % Apply 1 application topically 3 (three) times daily. 60 g 6   No current facility-administered medications on file prior to visit.     PAST MEDICAL HISTORY: Past Medical History:  Diagnosis Date  . A-fib ([place])   . Back pain   . Cancer ([place])    basal cell on the nose  . Edema    feet and legs  . Gallbladder disease   . HTN (hypertension)   . Hyperlipidemia   . Joint pain   . Loss of smell   . Loss of taste   . Palpitations   . Prediabetes   . Rectoceles   . SOB (shortness of breath)   . Swallowing difficulty   . Varicose vein of leg     PAST SURGICAL HISTORY: Past Surgical History:  Procedure Laterality Date  . ANKLE SURGERY     left ankle  . AV FISTULA PLACEMENT     leg  . CHOLECYSTECTOMY    .  GASTRIC BYPASS    . tummy tuck    . VEIN REPAIR      SOCIAL HISTORY: Social History  Substance Use Topics  . Smoking status: Former Research scientist (life sciences)  . Smokeless tobacco: Never Used  . Alcohol use Yes     Comment: occasional    FAMILY HISTORY: Family History  Problem Relation Age of Onset  . Diabetes Mother   . Hyperlipidemia Mother   . Sudden death Mother   . Obesity Mother   . Hypertension Father   . Hyperlipidemia Father   . Heart disease Father   . Sudden death Father     ROS: ROS  PHYSICAL EXAM: There were no vitals taken for this visit. There is no height or weight on file to calculate BMI. Physical Exam  Constitutional: She is oriented to person, place, and time. She appears well-developed and well-nourished. She appears distressed.  HENT:  Head: Normocephalic  and atraumatic.  Eyes: EOM are normal.  Cardiovascular: Normal rate.   Pulmonary/Chest: Effort normal.  Musculoskeletal: Normal range of motion.  Neurological: She is alert and oriented to person, place, and time. No cranial nerve deficit or sensory deficit. Coordination normal.  Normal speech with no facial droop.  Skin: Skin is warm and dry.  Psychiatric:  Pt highly emotional and tearful in office but was able to calm herself and discuss the situation reasonably.    RECENT LABS AND TESTS: BMET    Component Value Date/Time   NA 142 04/21/2016 1157   K 3.9 04/21/2016 1157   CL 100 04/21/2016 1157   CO2 24 04/21/2016 1157   GLUCOSE 101 (H) 04/21/2016 1157   BUN 20 04/21/2016 1157   CREATININE 1.08 (H) 04/21/2016 1157   CALCIUM 9.0 04/21/2016 1157   GFRNONAA 52 (L) 04/21/2016 1157   GFRAA 60 04/21/2016 1157   Lab Results  Component Value Date   HGBA1C 5.8 (H) 04/21/2016   Lab Results  Component Value Date   INSULIN 8.3 04/21/2016   CBC    Component Value Date/Time   WBC 5.4 04/21/2016 1157   RBC 3.87 04/21/2016 1157   HCT 34.7 04/21/2016 1157   PLT 227 04/21/2016 1157   MCV 90 04/21/2016 1157   MCH 29.5 04/21/2016 1157   MCHC 32.9 04/21/2016 1157   RDW 14.2 04/21/2016 1157   LYMPHSABS 0.8 04/21/2016 1157   EOSABS 0.0 04/21/2016 1157   BASOSABS 0.0 04/21/2016 1157   Iron/TIBC/Ferritin/ %Sat No results found for: IRON, TIBC, FERRITIN, IRONPCTSAT Lipid Panel     Component Value Date/Time   CHOL 124 04/21/2016 1157   TRIG 104 04/21/2016 1157   HDL 42 04/21/2016 1157   LDLCALC 61 04/21/2016 1157   Hepatic Function Panel     Component Value Date/Time   PROT 6.5 04/21/2016 1157   ALBUMIN 4.0 04/21/2016 1157   AST 18 04/21/2016 1157   ALT 17 04/21/2016 1157   ALKPHOS 67 04/21/2016 1157   BILITOT 0.8 04/21/2016 1157      Component Value Date/Time   TSH 1.410 04/21/2016 1157    ASSESSMENT AND PLAN: Disorientation  Class 2 obesity with serious  comorbidity and body mass index (BMI) of 35.0 to 35.9 in adult, unspecified obesity type  PLAN:  Pt has mild memory impairment and some disorientation which may be due to normal aging or anxiety versus dementia. She agreed to see Dr. Jaynee Eagles with Neurology today at noon for further evaluation. May need to D/C topiramate. Will follow up with patient in 2  weeks.   We spent > than 50% of the 60 minute visit on the counseling as documented in the note.   Jaclyn Lowe is currently in the action stage of change. As such, her goal is to continue with weight loss efforts She has agreed to portion control better and make smarter food choices, such as increase vegetables and decrease simple carbohydrates  Jaclyn Lowe has been instructed to work up to a goal of 150 minutes of combined cardio and strengthening exercise per week for weight loss and overall health benefits. We discussed the following Behavioral Modification Stratagies today: increasing lean protein intake and not skipping meals. We discussed various medication options to help Jaclyn Lowe, Jaclyn Lowe with her weight loss efforts and we both agreed to follow up with Dr. Jaynee Eagles to see if she should continue topiramate.  Jaclyn Lowe has agreed to follow up with our clinic in 2 weeks. She was informed of the importance of frequent follow up visits to maximize her success with intensive lifestyle modifications for her multiple health conditions.   I have reviewed the above documentation for accuracy and completeness, and I agree with the above. -Dennard Nip, MD   Office: [phone]  /  Fax: [phone]  OBESITY BEHAVIORAL INTERVENTION VISIT  Today's visit was # 8 out of 22.  Starting weight: 222 Starting date: 04/21/16 Today's weight :   199 Today's date: 08/03/2016 Total lbs lost to date: 23 (Patients must lose 7 lbs in the first 6 months to continue with counseling)   ASK: We discussed the diagnosis of obesity with Jaclyn Lowe today and Jaclyn Lowe agreed to give [name] permission to  discuss obesity behavioral modification therapy today.  ASSESS: Jaclyn Lowe has the diagnosis of obesity and her BMI today is @TBMI @ Jaclyn Lowe is in the action stage of change   ADVISE: Jaclyn Lowe was educated on the multiple health risks of obesity as well as the benefit of weight loss to improve her health. She was advised of the need for long term treatment and the importance of lifestyle modifications.  AGREE: Multiple dietary modification options and treatment options were discussed and  Jaclyn Lowe agreed to the above plan

## 2016-08-03 NOTE — Progress Notes (Signed)
[facility]    Provider:  Dr Jaynee Eagles Referring Provider: Dr. Dennard Nip Primary Care Physician:  Terald Sleeper, PA-C  CC:  Confusion, getting lost  HPI:  Jaclyn Lowe is a 71 y.o. female here as a referral from Dr. Ronnald Ramp for confusion. She is an urgent request by Dr. Leafy Ro. She has a past medical history of atrial fibrillation , back pain, basal cell carcinoma, depression, edema in her extremities, hypertension, hyperlipidemia, loss of smell, loss of taste, palpitations, prediabetes, shortness of breath. She has left facial droop from Bell's Palsy years ago. 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 Per review of records, Patient was confused about going to healthy weight and wellness clinic and has gotten lost multiple times. She was agitated and possibly disoriented, highly emotional and alternated between becoming angry and crying on both occasions of getting lost. The office convinced her to pull into a parking lot and sent two staff members to help her and guide her to the office. Patient stated, per chart review, after  much discussion admitted to more memory loss recently. She has a family history of Alzheimer's disease and is the primary caregiver for her husband. She has been on low-dose topiramate to help with emotional eating whitch may be affecting her memory.  She is here alone today, jokingly says she threw a "hissy fit" 2 days in a row because she couldn't find the healthy weight and wellness center and she became frustrated and lost and anxious. [place] in [place] and doesn't drive in [place] often. She is scared to drive her in [place], not used to it. She had never driven to the weight loss center alone before. She was mad and upset, mad "as hell". She denies getting lost before and never had this happen before, she always gets to places she knows.  She denies significant memory changes,  she is still working, she takes her medications compliantly, lives independently with husband, husband pays the bills and she pays some bills too and she never misses any, no falls, she denies disorientation. She feels great, she has lost weight and is thrilled. Today her BP is high due to stress and anxiety. She doesn't forget appointment, does not repeat herself, no one in the family has mentioned memory issues to her. She sometimes has dififculty finding words but they come out. She doe snot think her memory has been affected by Topiramate, she says she has normal memory changes seen with aging. She does not forget people's names clse to her. She says she was just venting at the [facility] [place] and she is sorry but she was frustrated with herself and her navigation system that wouldn't get her to the right location. Maternal Aunt with Alzheimers in her 66s. Her maternal first cousin is 83 with Alzheimers. Mother lived to 36 without memory issues. Father was 13 when passed. She has older brothers 24, sister 24 and other sister 70 who are both very sharp.   Reviewed notes, labs and imaging from outside physicians, which showed: B12 low-normal 287.  BMP February 2018 was unremarkable with BUN 20 and creatinine 1.08. Hemoglobin A1c 5.8. CBC was unremarkable with normal white blood cells. LDL 61, hepatic function panel normal.  Review of Systems: Patient complains of symptoms per HPI as well as the following symptoms: no rash, no SOB, no CP. Pertinent negatives and positives per HPI. All others negative.  Social History   Social History  . Marital status: Married    Spouse name: N/A  . Number of children: 2  . Years of education: Assoc in [place]   Occupational History  . Occupational health nurse [employer]   Social History Main Topics  . Smoking status: Former Research scientist (life sciences)  . Smokeless tobacco: Never Used     Comment: Only in college  . Alcohol use Yes     Comment: Occasional    . Drug use: No  . Sexual activity: Yes    Partners: Male    Birth control/ protection: Post-menopausal   Other Topics Concern  . Not on file   Social History Narrative   Lives at home w/ her husband   Left-handed   Caffeine: chocolate    Family History  Problem Relation Age of Onset  . Diabetes Mother   . Hyperlipidemia Mother   . Sudden death Mother   . Obesity Mother   . Hypertension Father   . Hyperlipidemia Father   . Heart disease Father   . Sudden death Father     Past Medical History:  Diagnosis Date  . A-fib ([place])   . Back pain   . Bell's palsy 2010  . Cancer ([place])    basal cell on the nose  . Edema    feet and legs  . Gallbladder disease   . HTN (hypertension)   . Hyperlipidemia   . Joint pain   . Loss of smell   . Loss of taste   . Palpitations   . Prediabetes   . Rectoceles   . SOB (shortness of breath)   . Swallowing difficulty   . Varicose vein of leg     Past Surgical History:  Procedure Laterality Date  . ANKLE SURGERY     left ankle  . AV FISTULA PLACEMENT     leg  . CHOLECYSTECTOMY    . GASTRIC BYPASS    . tummy tuck    . VEIN REPAIR      Current Outpatient Prescriptions  Medication Sig Dispense Refill  . apixaban (ELIQUIS) 5 MG TABS tablet Take 5 mg by mouth 2 (two) times daily.    Marland Kitchen atorvastatin (LIPITOR) 10 MG tablet Take 10 mg by mouth daily.    . cephALEXin (KEFLEX) 500 MG capsule Take 1 capsule (500 mg total) by mouth 4 (four) times daily. 40 capsule 1  . Cholecalciferol (VITAMIN D3) 50000 units CAPS Take 1 Dose by mouth once a week. 4 capsule 0  . lisinopril (PRINIVIL,ZESTRIL) 20 MG tablet TAKE 1 TABLET BY MOUTH ONCE DAILY 30 tablet 0  . topiramate (TOPAMAX) 50 MG tablet Take 1 tablet (50 mg total) by mouth 2 (two) times daily. 30 tablet 0  . triamcinolone cream (KENALOG) 0.5 % Apply 1 application topically 3 (three) times daily. 60 g 6  . hydrochlorothiazide (MICROZIDE) 12.5 MG capsule Take 1 capsule (12.5 mg total) by  mouth daily. (Patient not taking: Reported on 08/03/2016) 30 capsule 0   No current facility-administered medications for this visit.     Allergies as of 08/03/2016 - Review Complete 08/03/2016  Allergen Reaction Noted  . Neosporin [neomycin-bacitracin zn-polymyx] Itching and Rash 04/21/2016    Vitals: BP (!) 163/77   Pulse 70   Ht 5\' 3"  (1.6 m)   Wt 199 lb 6.4 oz (90.4 kg)   BMI 35.32 kg/m  Last Weight:  Wt Readings from Last 1 Encounters:  08/03/16 199 lb 6.4 oz (90.4 kg)  Last Height:   Ht Readings from Last 1 Encounters:  08/03/16 5\' 3"  (1.6 m)   Physical exam: Exam: Gen: NAD, labile Mood, conversant, well nourised, obese, well groomed                     CV: RRR. No Carotid Bruits. +peripheral edema, warm, nontender Eyes: Conjunctivae clear without exudates or hemorrhage  Neuro: Detailed Neurologic Exam  Speech:    Speech is normal; fluent and spontaneous with normal comprehension.  Cognition:  MMSE - Mini Mental State Exam 08/03/2016  Orientation to time 5  Orientation to Place 5  Registration 3  Attention/ Calculation 5  Recall 3  Language- name 2 objects 2  Language- repeat 1  Language- follow 3 step command 3  Language- read & follow direction 1  Write a sentence 0  Copy design 1  Total score 29      The patient is oriented to person, place, and time;     recent and remote memory intact;     language fluent;     normal attention, concentration, fund of knowledge Cranial Nerves:    The pupils are equal, round, and reactive to light.heterochromia iridum. Attempted funduscopic exam could not visualize due to small pupils. Visual fields are full to finger confrontation. Extraocular movements are intact. Trigeminal sensation is intact and the muscles of mastication are normal. Left ptosis. The palate elevates in the midline. Hearing intact. Voice is normal. Shoulder shrug is normal. The tongue has normal motion without fasciculations.   Coordination:    No  dysmetria  Gait:    Not ataxic  Motor Observation:    No asymmetry, no atrophy, and no involuntary movements noted. Tone:    Normal muscle tone.    Posture:    Posture is normal. normal erect    Strength:    Strength is V/V in the upper and lower limbs.      Sensation: intact to LT     Reflex Exam:  DTR's:    Absent AJ's Toes:    The toes are downgoing bilaterally.   Clonus:    Clonus is absent.  Assessment/Plan:  71 y.o. female here as a referral  for confusion. She has a past medical history of atrial fibrillation , back pain, basal cell carcinoma, depression, edema in her extremities, hypertension, hyperlipidemia, loss of smell, loss of taste, palpitations, prediabetes, shortness of breath. She has left facial droop from Bell's Palsy years ago. 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 Patient is a healthy weight and wellness clinic today and appeared quite agitated and possibly disoriented, highly emotional after getting significantly lost 2 days in a row necessitating staff to go and find her. Patient admitted to memory changes and she was sent here for evaluation as the clinic didn't want want to let her go home before being evaluated for possible confusion, disorientation or dementia. Patient's mood is quite labile today however he states that she is just frustrated and angry because she does not drive in [place] often and could not find the office in her navigation program was giving her difficulty. She denies getting lost in areas she knows well, denies significant memory changes possibly only mild age-related changes, she is still working, lives independently with husband, bills are paid and she is compliant with her medications but reports stress and anxiety. She states family members haven't mentioned anything out of the ordinary in regards to her memory or  behavior recently. MMSE 29/30 normal. Maternal Aunt with Alzheimers in her 80s. Her  maternal first cousin is 21 with Alzheimers. Mother lived to 38 without memory issues. Father was 51 when passed. She has older brothers 56, sister 25 and other sister 64 who are both very sharp.   Her mood is quite Labile today alternating between being frustrated, angry, laughing and at one instance quite ecstatic due to recent weight loss. I'm waiting to speak with her daughter to see if this is normally patient's personality or if they've noticed any alterations in mentation or cognition. I've left a message for her daughter to return my phone call.  Patient declines any imaging or further workup. I did notice though that her B12 is low-normal 287 and so will check again along with methylmalonic acid as B12 deficiency can cause dementia and other neuropsychiatric symptoms.  Daughter works for [employer] cone: Amy in Designer, television/film set in [place]. Amy is on patient's HIPPA form. [phone]. Will speak to to daughter and discuss what happened and see if family has any concerns about patient's memory or behavior or driving concerns. Left message for daughter.   Patient was alert and oriented and so we let her leave the office today and drive home. We'll follow up with family and if they have any concerns we will have patient back with family members for more workup. We'll also touch base with her primary care physician Particia Nearing.  Sarina Ill, MD  [facility] [address] [place], [place] [address]  Phone [phone] Fax [phone]

## 2016-08-05 LAB — B12 AND FOLATE PANEL
Folate: 14.4 ng/mL (ref >3.0)
VITAMIN B 12: 312 pg/mL (ref 232–1245)

## 2016-08-05 LAB — METHYLMALONIC ACID, SERUM: Methylmalonic Acid: 219 nmol/L (ref 0–378)

## 2016-08-08 ENCOUNTER — Other Ambulatory Visit (INDEPENDENT_AMBULATORY_CARE_PROVIDER_SITE_OTHER): Payer: Self-pay | Admitting: Family Medicine

## 2016-08-08 DIAGNOSIS — E559 Vitamin D deficiency, unspecified: Secondary | ICD-10-CM

## 2016-08-09 ENCOUNTER — Ambulatory Visit: Payer: Managed Care, Other (non HMO) | Admitting: Physician Assistant

## 2016-08-19 ENCOUNTER — Ambulatory Visit (INDEPENDENT_AMBULATORY_CARE_PROVIDER_SITE_OTHER): Payer: Managed Care, Other (non HMO) | Admitting: Family Medicine

## 2016-08-19 VITALS — BP 167/83 | HR 72 | Temp 98.4°F | Ht 63.0 in | Wt 204.0 lb

## 2016-08-19 DIAGNOSIS — Z6836 Body mass index (BMI) 36.0-36.9, adult: Secondary | ICD-10-CM | POA: Diagnosis not present

## 2016-08-19 DIAGNOSIS — E669 Obesity, unspecified: Secondary | ICD-10-CM | POA: Diagnosis not present

## 2016-08-19 DIAGNOSIS — R7303 Prediabetes: Secondary | ICD-10-CM | POA: Diagnosis not present

## 2016-08-19 DIAGNOSIS — F3289 Other specified depressive episodes: Secondary | ICD-10-CM | POA: Diagnosis not present

## 2016-08-19 DIAGNOSIS — E559 Vitamin D deficiency, unspecified: Secondary | ICD-10-CM

## 2016-08-19 DIAGNOSIS — E66812 Obesity, class 2: Secondary | ICD-10-CM

## 2016-08-19 DIAGNOSIS — I10 Essential (primary) hypertension: Secondary | ICD-10-CM | POA: Diagnosis not present

## 2016-08-19 MED ORDER — TOPIRAMATE 50 MG PO TABS: 50.0000 mg | ORAL_TABLET | Freq: Two times a day (BID) | ORAL | 0 refills | Status: DC

## 2016-08-19 MED ORDER — LISINOPRIL 20 MG PO TABS: 20.0000 mg | ORAL_TABLET | Freq: Every day | ORAL | 0 refills | Status: DC

## 2016-08-19 NOTE — Progress Notes (Signed)
Office: [phone]  /  Fax: [phone]   HPI:   Chief Complaint: OBESITY Jaclyn Lowe is here to discuss her progress with her obesity treatment plan. She is on the  portion control better and make smarter food choices, such as increase vegetables and decrease simple carbohydrates  and is following her eating plan approximately 50 % of the time. She states she is exercising chasing grandbabies 3 to 4 times per week. Jaclyn Lowe gained 6 pounds of water weight due to discontinuing her HCTZ. She is not following a structured plan recently, mostly portion control/smart choices. She is ready to get back on track. Her weight is 204 lb (92.5 kg) today and has had a weight gain of 6 pounds over a period of 2 weeks since her last visit. She has lost 18 lbs since starting treatment with [name].  Vitamin D deficiency Jaclyn Lowe has a diagnosis of vitamin D deficiency. She is currently stable on vit D, not yet at goal and denies nausea, vomiting or muscle weakness.  Pre-Diabetes Jaclyn Lowe has a diagnosis of pre-diabetes based on her elevated Hgb A1c and was informed this puts her at greater risk of developing diabetes. She is not taking metformin currently and she is attempting to control with diet and exercise to decrease risk of diabetes. She denies nausea or hypoglycemia. Jaclyn Lowe is due for labs.  Hypertension Jaclyn Lowe is a 71 y.o. female with hypertension, her blood pressure is elevated. She is off HCTZ as she thought this caused cramping.  Jaclyn Lowe denies chest pain or shortness of breath on exertion. She is working weight loss to help control her blood pressure with the goal of decreasing her risk of heart attack and stroke. Jaclyn Lowe blood pressure is not currently controlled.  Depression with emotional eating behaviors Jaclyn Lowe's mood is stable and she has done better with her emotional eating. Jaclyn Lowe struggles with emotional eating and using food for comfort to the extent that it is negatively impacting her health. She often  snacks when she is not hungry. Jaclyn Lowe sometimes feels she is out of control and then feels guilty that she made poor food choices. She has been working on behavior modification techniques to help reduce her emotional eating and has been somewhat successful. She shows no sign of suicidal or homicidal ideations.  Depression screen Jaclyn Lowe 2/9 07/14/2016 04/21/2016  Decreased Interest 0 2  Down, Depressed, Hopeless 0 2  PHQ - 2 Score 0 4  Altered sleeping - 1  Tired, decreased energy - 3  Change in appetite - 3  Feeling bad or failure about yourself  - 2  Trouble concentrating - 2  Moving slowly or fidgety/restless - 2  Suicidal thoughts - 0  PHQ-9 Score - 17     ALLERGIES: Allergies  Allergen Reactions   Neosporin [Neomycin-Bacitracin Zn-Polymyx] Itching and Rash    MEDICATIONS: Current Outpatient Prescriptions on File Prior to Visit  Medication Sig Dispense Refill   apixaban (ELIQUIS) 5 MG TABS tablet Take 5 mg by mouth 2 (two) times daily.     atorvastatin (LIPITOR) 10 MG tablet Take 10 mg by mouth daily.     cephALEXin (KEFLEX) 500 MG capsule Take 1 capsule (500 mg total) by mouth 4 (four) times daily. 40 capsule 1   OPTIMAL-D 32202 units capsule TAKE 1 CAPSULE BY MOUTH ONCE A WEEK 4 capsule 0   triamcinolone cream (KENALOG) 0.5 % Apply 1 application topically 3 (three) times daily. 60 g 6   No current facility-administered medications on file  prior to visit.     PAST MEDICAL HISTORY: Past Medical History:  Diagnosis Date   A-fib ([place])    Back pain    Bell's palsy 2010   Cancer ([place])    basal cell on the nose   Edema    feet and legs   Gallbladder disease    HTN (hypertension)    Hyperlipidemia    Joint pain    Loss of smell    Loss of taste    Palpitations    Prediabetes    Rectoceles    SOB (shortness of breath)    Swallowing difficulty    Varicose vein of leg     PAST SURGICAL HISTORY: Past Surgical History:  Procedure Laterality Date     ANKLE SURGERY     left ankle   AV FISTULA PLACEMENT     leg   CHOLECYSTECTOMY     GASTRIC BYPASS     tummy tuck     VEIN REPAIR      SOCIAL HISTORY: Social History  Substance Use Topics   Smoking status: Former Smoker   Smokeless tobacco: Never Used     Comment: Only in college   Alcohol use Yes     Comment: Occasional    FAMILY HISTORY: Family History  Problem Relation Age of Onset   Diabetes Mother    Hyperlipidemia Mother    Sudden death Mother    Obesity Mother    Hypertension Father    Hyperlipidemia Father    Heart disease Father    Sudden death Father     ROS: Review of Systems  Constitutional: Negative for weight loss.  Respiratory: Negative for shortness of breath (on exertion).   Cardiovascular: Negative for chest pain.  Gastrointestinal: Negative for nausea and vomiting.  Musculoskeletal:       Negative muscle weakness  Endo/Heme/Allergies:       Negative hypoglycemia  Psychiatric/Behavioral: Positive for depression. Negative for suicidal ideas.    PHYSICAL EXAM: Blood pressure (!) 167/83, pulse 72, temperature 98.4 F (36.9 C), temperature source Oral, height 5\' 3"  (1.6 m), weight 204 lb (92.5 kg), SpO2 98 %. Body mass index is 36.14 kg/m. Physical Exam  Constitutional: She is oriented to person, place, and time. She appears well-developed and well-nourished.  Cardiovascular: Normal rate.   Pulmonary/Chest: Effort normal.  Musculoskeletal: Normal range of motion.  Neurological: She is oriented to person, place, and time.  Skin: Skin is warm and dry.  Vitals reviewed.   RECENT LABS AND TESTS: BMET    Component Value Date/Time   NA 142 04/21/2016 1157   K 3.9 04/21/2016 1157   CL 100 04/21/2016 1157   CO2 24 04/21/2016 1157   GLUCOSE 101 (H) 04/21/2016 1157   BUN 20 04/21/2016 1157   CREATININE 1.08 (H) 04/21/2016 1157   CALCIUM 9.0 04/21/2016 1157   GFRNONAA 52 (L) 04/21/2016 1157   GFRAA 60 04/21/2016 1157    Lab Results  Component Value Date   HGBA1C 5.8 (H) 04/21/2016   Lab Results  Component Value Date   INSULIN 8.3 04/21/2016   CBC    Component Value Date/Time   WBC 5.4 04/21/2016 1157   RBC 3.87 04/21/2016 1157   HGB 11.4 04/21/2016 1157   HCT 34.7 04/21/2016 1157   PLT 227 04/21/2016 1157   MCV 90 04/21/2016 1157   MCH 29.5 04/21/2016 1157   MCHC 32.9 04/21/2016 1157   RDW 14.2 04/21/2016 1157   LYMPHSABS 0.8 04/21/2016 1157  EOSABS 0.0 04/21/2016 1157   BASOSABS 0.0 04/21/2016 1157   Iron/TIBC/Ferritin/ %Sat No results found for: IRON, TIBC, FERRITIN, IRONPCTSAT Lipid Panel     Component Value Date/Time   CHOL 124 04/21/2016 1157   TRIG 104 04/21/2016 1157   HDL 42 04/21/2016 1157   LDLCALC 61 04/21/2016 1157   Hepatic Function Panel     Component Value Date/Time   PROT 6.5 04/21/2016 1157   ALBUMIN 4.0 04/21/2016 1157   AST 18 04/21/2016 1157   ALT 17 04/21/2016 1157   ALKPHOS 67 04/21/2016 1157   BILITOT 0.8 04/21/2016 1157      Component Value Date/Time   TSH 1.410 04/21/2016 1157    ASSESSMENT AND PLAN: Vitamin D deficiency - Plan: VITAMIN D 25 Hydroxy (Vit-D Deficiency, Fractures)  Prediabetes - Plan: Comprehensive metabolic panel, Hemoglobin A1c, Insulin, random  Essential hypertension - Plan: lisinopril (PRINIVIL,ZESTRIL) 20 MG tablet  Other depression - Plan: topiramate (TOPAMAX) 50 MG tablet  Class 2 obesity without serious comorbidity with body mass index (BMI) of 36.0 to 36.9 in adult, unspecified obesity type  PLAN:  Vitamin D Deficiency Jaclyn Lowe was informed that low vitamin D levels contributes to fatigue and are associated with obesity, breast, and colon cancer. She agrees to continue to take prescription Vit D @50 ,000 IU every week and we will check labs and will follow up for routine testing of vitamin D, at least 2-3 times per year. She was informed of the risk of over-replacement of vitamin D and agrees to not increase her dose  unless he discusses this with [name] first. Jaclyn Lowe agrees to follow up with our clinic in 3 weeks.  Pre-Diabetes Jaclyn Lowe will continue to work on weight loss, exercise, and decreasing simple carbohydrates in her diet to help decrease the risk of diabetes. She was informed that eating too many simple carbohydrates or too many calories at one sitting increases the likelihood of GI side effects. We will check labs and Jaclyn Lowe agreed to follow up with [name] as directed to monitor her progress.  Hypertension We discussed sodium restriction, working on healthy weight loss, and a regular exercise program as the means to achieve improved blood pressure control. Jaclyn Lowe agreed with this plan and agreed to follow up as directed. We will check labs and re-check blood pressure in 3 weeks and will continue to monitor her blood pressure as well as her progress with the above lifestyle modifications. She agrees to continue lisinopril, we will refill for 1 month and she will watch for signs of hypotension as she continues her lifestyle modifications.   Depression with Emotional Eating Behaviors We discussed behavior modification techniques today to help Jaclyn Lowe deal with her emotional eating and depression. She has agreed to continue topamax, we will refill for 1 month and she agreed to follow up as directed.  Obesity Jaclyn Lowe is currently in the action stage of change. As such, her goal is to continue with weight loss efforts She has agreed to follow the Category 2 plan Jaclyn Lowe has been instructed to work up to a goal of 150 minutes of combined cardio and strengthening exercise per week or start cardio 10 to 15 minutes 5 times per week for weight loss and overall health benefits. We discussed the following Behavioral Modification Strategies today: decrease eating out, no skipping meals and planning for success  Jaclyn Lowe has agreed to follow up with our clinic in 3 weeks. She was informed of the importance of frequent follow up visits to  maximize her  success with intensive lifestyle modifications for her multiple health conditions.  I, Jaclyn Lowe, am acting as transcriptionist for Jaclyn Nip, MD  I have reviewed the above documentation for accuracy and completeness, and I agree with the above. -Jaclyn Nip, MD  OBESITY BEHAVIORAL INTERVENTION VISIT  Today's visit was # 8 out of 22.  Starting weight: 222 lbs Starting date: 04/21/16 Today's weight : 204 lbs  Today's date: 08/19/2016 Total lbs lost to date: 97 (Patients must lose 7 lbs in the first 6 months to continue with counseling)   ASK: We discussed the diagnosis of obesity with [name] today and Stanton Kidney agreed to give [name] permission to discuss obesity behavioral modification therapy today.  ASSESS: Tawn has the diagnosis of obesity and her BMI today is 51.2 [place] is in the action stage of change   ADVISE: Zeriyah was educated on the multiple health risks of obesity as well as the benefit of weight loss to improve her health. She was advised of the need for long term treatment and the importance of lifestyle modifications.  AGREE: Multiple dietary modification options and treatment options were discussed and  Kateryna agreed to follow the Category 2 plan We discussed the following Behavioral Modification Strategies today: decrease eating out, no skipping meals and planning for success

## 2016-08-20 LAB — COMPREHENSIVE METABOLIC PANEL
A/G RATIO: 2.1 (ref 1.2–2.2)
ALBUMIN: 4.2 g/dL (ref 3.5–4.8)
ALT: 18 IU/L (ref 0–32)
AST: 18 IU/L (ref 0–40)
Alkaline Phosphatase: 60 IU/L (ref 39–117)
BILIRUBIN TOTAL: 0.8 mg/dL (ref 0.0–1.2)
BUN / CREAT RATIO: 18 (ref 12–28)
BUN: 18 mg/dL (ref 8–27)
CHLORIDE: 109 mmol/L — AB (ref 96–106)
CO2: 20 mmol/L (ref 20–29)
Calcium: 8.9 mg/dL (ref 8.7–10.3)
Creatinine, Ser: 0.98 mg/dL (ref 0.57–1.00)
GFR calc non Af Amer: 59 mL/min/{1.73_m2} — ABNORMAL LOW (ref >59)
GFR, EST AFRICAN AMERICAN: 68 mL/min/{1.73_m2} (ref >59)
Globulin, Total: 2 g/dL (ref 1.5–4.5)
Glucose: 106 mg/dL — ABNORMAL HIGH (ref 65–99)
POTASSIUM: 4.1 mmol/L (ref 3.5–5.2)
Sodium: 145 mmol/L — ABNORMAL HIGH (ref 134–144)
TOTAL PROTEIN: 6.2 g/dL (ref 6.0–8.5)

## 2016-08-20 LAB — HEMOGLOBIN A1C
Est. average glucose Bld gHb Est-mCnc: 111 mg/dL
Hgb A1c MFr Bld: 5.5 % (ref 4.8–5.6)

## 2016-08-20 LAB — VITAMIN D 25 HYDROXY (VIT D DEFICIENCY, FRACTURES): Vit D, 25-Hydroxy: 49.5 ng/mL (ref 30.0–100.0)

## 2016-08-20 LAB — INSULIN, RANDOM: INSULIN: 8 u[IU]/mL (ref 2.6–24.9)

## 2016-08-24 ENCOUNTER — Ambulatory Visit (INDEPENDENT_AMBULATORY_CARE_PROVIDER_SITE_OTHER): Payer: Managed Care, Other (non HMO) | Admitting: Family Medicine

## 2016-09-04 ENCOUNTER — Other Ambulatory Visit: Payer: Self-pay | Admitting: Physician Assistant

## 2016-09-06 NOTE — Telephone Encounter (Signed)
Last seen 07/14/16  Angel 

## 2016-09-06 NOTE — Telephone Encounter (Signed)
Defer to PCP as she will be back tomorrow.

## 2016-09-09 ENCOUNTER — Ambulatory Visit (INDEPENDENT_AMBULATORY_CARE_PROVIDER_SITE_OTHER): Payer: Medicare Other | Admitting: Family Medicine

## 2016-09-09 VITALS — BP 156/86 | HR 69 | Temp 98.1°F | Ht 63.0 in | Wt 198.0 lb

## 2016-09-09 DIAGNOSIS — E559 Vitamin D deficiency, unspecified: Secondary | ICD-10-CM

## 2016-09-09 DIAGNOSIS — Z6835 Body mass index (BMI) 35.0-35.9, adult: Secondary | ICD-10-CM | POA: Diagnosis not present

## 2016-09-09 DIAGNOSIS — E669 Obesity, unspecified: Secondary | ICD-10-CM | POA: Diagnosis not present

## 2016-09-09 DIAGNOSIS — I10 Essential (primary) hypertension: Secondary | ICD-10-CM

## 2016-09-09 DIAGNOSIS — F3289 Other specified depressive episodes: Secondary | ICD-10-CM

## 2016-09-09 MED ORDER — VITAMIN D (ERGOCALCIFEROL) 1.25 MG (50000 UNIT) PO CAPS: 50000.0000 [IU] | ORAL_CAPSULE | ORAL | 0 refills | Status: DC

## 2016-09-09 MED ORDER — TOPIRAMATE 50 MG PO TABS: 50.0000 mg | ORAL_TABLET | Freq: Two times a day (BID) | ORAL | 0 refills | Status: DC

## 2016-09-09 MED ORDER — LISINOPRIL 20 MG PO TABS: 20.0000 mg | ORAL_TABLET | Freq: Every day | ORAL | 0 refills | Status: DC

## 2016-09-09 NOTE — Progress Notes (Signed)
Office: [phone]  /  Fax: [phone]   HPI:   Chief Complaint: OBESITY Jaclyn Lowe is here to discuss her progress with her obesity treatment plan. She is on the  follow the Category 2 plan and is following her eating plan approximately 80 to 90 % of the time. She states she is exercising 0 minutes 0 times per week. Jaclyn Lowe continues to do well with weight loss, even though she is deviating from the plan more. She sometimes skips meals and has eaten fried chicken livers recently. Her weight is 198 lb (89.8 kg) today and has had a weight loss of 6 pounds over a period of 3 weeks since her last visit. She has lost 24 lbs since starting treatment with [name].  Vitamin D deficiency Jaclyn Lowe has a diagnosis of vitamin D deficiency. She is currently stable on vit D and denies nausea, vomiting or muscle weakness.  Hypertension Jaclyn Lowe is a 71 y.o. female with hypertension. Her blood pressure is still elevated on lisinopril and she feels this is due to stress with driving in traffic. Jaclyn Lowe denies chest pain or shortness of breath on exertion. She is working weight loss to help control her blood pressure with the goal of decreasing her risk of heart attack and stroke. Jaclyn Lowe blood pressure is not currently controlled.  Depression with emotional eating behaviors Jaclyn Lowe is struggling with emotional eating and using food for comfort to the extent that it is negatively impacting her health. She often snacks when she is not hungry. Jaclyn Lowe sometimes feels she is out of control and then feels guilty that she made poor food choices. She has been working on behavior modification techniques to help reduce her emotional eating and has been somewhat successful. She is currently stable and she shows no sign of suicidal or homicidal ideations.  Depression screen Jaclyn Lowe 2/9 07/14/2016 04/21/2016  Decreased Interest 0 2  Down, Depressed, Hopeless 0 2  PHQ - 2 Score 0 4  Altered sleeping - 1  Tired, decreased energy - 3    Change in appetite - 3  Feeling bad or failure about yourself  - 2  Trouble concentrating - 2  Moving slowly or fidgety/restless - 2  Suicidal thoughts - 0  PHQ-9 Score - 17     ALLERGIES: Allergies  Allergen Reactions   Neosporin [Neomycin-Bacitracin Zn-Polymyx] Itching and Rash    MEDICATIONS: Current Outpatient Prescriptions on File Prior to Visit  Medication Sig Dispense Refill   apixaban (ELIQUIS) 5 MG TABS tablet Take 5 mg by mouth 2 (two) times daily.     atorvastatin (LIPITOR) 10 MG tablet Take 10 mg by mouth daily.     cephALEXin (KEFLEX) 500 MG capsule TAKE ONE (1) CAPSULE FOUR (4) TIMES EACH DAY 40 capsule 0   triamcinolone cream (KENALOG) 0.5 % Apply 1 application topically 3 (three) times daily. 60 g 6   No current facility-administered medications on file prior to visit.     PAST MEDICAL HISTORY: Past Medical History:  Diagnosis Date   A-fib ([place])    Back pain    Bell's palsy 2010   Cancer ([place])    basal cell on the nose   Edema    feet and legs   Gallbladder disease    HTN (hypertension)    Hyperlipidemia    Joint pain    Loss of smell    Loss of taste    Palpitations    Prediabetes    Rectoceles    SOB (shortness  of breath)    Swallowing difficulty    Varicose vein of leg     PAST SURGICAL HISTORY: Past Surgical History:  Procedure Laterality Date   ANKLE Jaclyn     left ankle   AV FISTULA PLACEMENT     leg   CHOLECYSTECTOMY     GASTRIC BYPASS     tummy tuck     VEIN REPAIR      SOCIAL HISTORY: Social History  Substance Use Topics   Smoking status: Former Smoker   Smokeless tobacco: Never Used     Comment: Only in college   Alcohol use Yes     Comment: Occasional    FAMILY HISTORY: Family History  Problem Relation Age of Onset   Diabetes Mother    Hyperlipidemia Mother    Sudden death Mother    Obesity Mother    Hypertension Father    Hyperlipidemia Father    Heart disease Father     Sudden death Father     ROS: Review of Systems  Constitutional: Positive for weight loss.  Respiratory: Negative for shortness of breath (on exertion).   Cardiovascular: Negative for chest pain.  Gastrointestinal: Negative for nausea and vomiting.  Musculoskeletal:       Negative muscle weakness  Psychiatric/Behavioral: Positive for depression. Negative for suicidal ideas.    PHYSICAL EXAM: Blood pressure (!) 156/86, pulse 69, temperature 98.1 F (36.7 C), temperature source Oral, height 5\' 3"  (1.6 m), weight 198 lb (89.8 kg), SpO2 100 %. Body mass index is 35.07 kg/m. Physical Exam  Constitutional: She is oriented to person, place, and time. She appears well-developed and well-nourished.  Cardiovascular: Normal rate.   Pulmonary/Chest: Effort normal.  Musculoskeletal: Normal range of motion.  Neurological: She is oriented to person, place, and time.  Skin: Skin is warm and dry.  Psychiatric: She has a normal mood and affect. Her behavior is normal.  Vitals reviewed.   RECENT LABS AND TESTS: BMET    Component Value Date/Time   NA 145 (H) 08/19/2016 0838   K 4.1 08/19/2016 0838   CL 109 (H) 08/19/2016 0838   CO2 20 08/19/2016 0838   GLUCOSE 106 (H) 08/19/2016 0838   BUN 18 08/19/2016 0838   CREATININE 0.98 08/19/2016 0838   CALCIUM 8.9 08/19/2016 0838   GFRNONAA 59 (L) 08/19/2016 0838   GFRAA 68 08/19/2016 0838   Lab Results  Component Value Date   HGBA1C 5.5 08/19/2016   HGBA1C 5.8 (H) 04/21/2016   Lab Results  Component Value Date   INSULIN 8.0 08/19/2016   INSULIN 8.3 04/21/2016   CBC    Component Value Date/Time   WBC 5.4 04/21/2016 1157   RBC 3.87 04/21/2016 1157   HGB 11.4 04/21/2016 1157   HCT 34.7 04/21/2016 1157   PLT 227 04/21/2016 1157   MCV 90 04/21/2016 1157   MCH 29.5 04/21/2016 1157   MCHC 32.9 04/21/2016 1157   RDW 14.2 04/21/2016 1157   LYMPHSABS 0.8 04/21/2016 1157   EOSABS 0.0 04/21/2016 1157   BASOSABS 0.0 04/21/2016 1157    Iron/TIBC/Ferritin/ %Sat No results found for: IRON, TIBC, FERRITIN, IRONPCTSAT Lipid Panel     Component Value Date/Time   CHOL 124 04/21/2016 1157   TRIG 104 04/21/2016 1157   HDL 42 04/21/2016 1157   LDLCALC 61 04/21/2016 1157   Hepatic Function Panel     Component Value Date/Time   PROT 6.2 08/19/2016 0838   ALBUMIN 4.2 08/19/2016 0838   AST 18 08/19/2016 0838  ALT 18 08/19/2016 0838   ALKPHOS 60 08/19/2016 0838   BILITOT 0.8 08/19/2016 0838      Component Value Date/Time   TSH 1.410 04/21/2016 1157    ASSESSMENT AND PLAN: Essential hypertension - Plan: lisinopril (PRINIVIL,ZESTRIL) 20 MG tablet  Other depression - Plan: topiramate (TOPAMAX) 50 MG tablet  Vitamin D deficiency - Plan: Vitamin D, Ergocalciferol, (DRISDOL) 50000 units CAPS capsule  Class 2 obesity without serious comorbidity with body mass index (BMI) of 35.0 to 35.9 in adult, unspecified obesity type  PLAN:  Vitamin D Deficiency Jaclyn Lowe was informed that low vitamin D levels contributes to fatigue and are associated with obesity, breast, and colon cancer. She agrees to continue to take prescription Vit D @50 ,000 IU every week, we will refill for 1 month and will follow up for routine testing of vitamin D, at least 2-3 times per year. She was informed of the risk of over-replacement of vitamin D and agrees to not increase her dose unless he discusses this with [name] first. Jaclyn Lowe agrees to follow up with our clinic in 2 to 3 weeks.  Hypertension We discussed sodium restriction, working on healthy weight loss, and a regular exercise program as the means to achieve improved blood pressure control. Jaclyn Lowe agreed with this plan and agreed to follow up as directed. We will continue to monitor her blood pressure as well as her progress with the above lifestyle modifications. She agrees to continue lisinopril as prescribed, we will refill for 1 month and she will watch for signs of hypotension as she continues her  lifestyle modifications.  Depression with Emotional Eating Behaviors We discussed behavior modification techniques today to help Jaclyn Lowe deal with her emotional eating and depression. She has agreed to continue to take Topamax, we will refill for 1 month and she will follow up as directed.  Obesity Jaclyn Lowe is currently in the action stage of change. As such, her goal is to continue with weight loss efforts She has agreed to follow the Category 2 plan Jaclyn Lowe has been instructed to work up to a goal of 150 minutes of combined cardio and strengthening exercise per week for weight loss and overall health benefits. We discussed the following Behavioral Modification Stratagies today: no skipping meals, increasing lean protein intake, decreasing simple carbohydrates  and decrease eating out  Jaclyn Lowe has agreed to follow up with our clinic in 2 to 3 weeks. She was informed of the importance of frequent follow up visits to maximize her success with intensive lifestyle modifications for her multiple health conditions.  I, Doreene Nest, am acting as transcriptionist for Jaclyn Nip, MD  I have reviewed the above documentation for accuracy and completeness, and I agree with the above. -Jaclyn Nip, MD  OBESITY BEHAVIORAL INTERVENTION VISIT  Today's visit was # 9 out of 22.  Starting weight: 222 lbs Starting date: 04/21/16 Today's weight : 198 lbs  Today's date: 09/09/2016 Total lbs lost to date: 24 (Patients must lose 7 lbs in the first 6 months to continue with counseling)   ASK: We discussed the diagnosis of obesity with Jaclyn Lowe today and Jaclyn Lowe agreed to give [name] permission to discuss obesity behavioral modification therapy today.  ASSESS: Glory has the diagnosis of obesity and her BMI today is 35.1 Jaclyn Lowe is in the action stage of change   ADVISE: Jaclyn Lowe was educated on the multiple health risks of obesity as well as the benefit of weight loss to improve her health. She was advised of the need for  long term treatment and the importance of lifestyle modifications.  AGREE: Multiple dietary modification options and treatment options were discussed and  Jaclyn Lowe agreed to follow the Category 2 plan We discussed the following Behavioral Modification Strategies today: no skipping meals, increasing lean protein intake, decreasing simple carbohydrates  and decrease eating out

## 2016-09-13 ENCOUNTER — Encounter (INDEPENDENT_AMBULATORY_CARE_PROVIDER_SITE_OTHER): Payer: Self-pay

## 2016-10-07 ENCOUNTER — Ambulatory Visit (INDEPENDENT_AMBULATORY_CARE_PROVIDER_SITE_OTHER): Payer: Managed Care, Other (non HMO) | Admitting: Family Medicine

## 2016-10-07 VITALS — BP 127/77 | HR 57 | Temp 98.3°F | Ht 63.0 in | Wt 191.0 lb

## 2016-10-07 DIAGNOSIS — F3289 Other specified depressive episodes: Secondary | ICD-10-CM

## 2016-10-07 DIAGNOSIS — E669 Obesity, unspecified: Secondary | ICD-10-CM

## 2016-10-07 DIAGNOSIS — E559 Vitamin D deficiency, unspecified: Secondary | ICD-10-CM | POA: Diagnosis not present

## 2016-10-07 DIAGNOSIS — Z6833 Body mass index (BMI) 33.0-33.9, adult: Secondary | ICD-10-CM | POA: Diagnosis not present

## 2016-10-07 DIAGNOSIS — I10 Essential (primary) hypertension: Secondary | ICD-10-CM

## 2016-10-07 MED ORDER — TOPIRAMATE 50 MG PO TABS: 50.0000 mg | ORAL_TABLET | Freq: Two times a day (BID) | ORAL | 0 refills | Status: DC

## 2016-10-07 MED ORDER — LISINOPRIL 20 MG PO TABS: 20.0000 mg | ORAL_TABLET | Freq: Every day | ORAL | 0 refills | Status: DC

## 2016-10-07 MED ORDER — VITAMIN D (ERGOCALCIFEROL) 1.25 MG (50000 UNIT) PO CAPS: 50000.0000 [IU] | ORAL_CAPSULE | ORAL | 0 refills | Status: DC

## 2016-10-07 NOTE — Progress Notes (Signed)
Office: [phone]  /  Fax: [phone]   HPI:   Chief Complaint: OBESITY Jaclyn Lowe is here to discuss her progress with her obesity treatment plan. She is on the Category 2 plan and is following her eating plan approximately 90 % of the time. She states she is exercising 0 minutes 0 times per week. Jaclyn Lowe continues to do well with weight loss. Hunger is controlled and she feels more in control of her eating and meal planning. Her weight is 191 lb (86.6 kg) today and has had a weight loss of 7 pounds over a period of 4 weeks since her last visit. She has lost 31 lbs since starting treatment with [name].  Vitamin D deficiency Jaclyn Lowe has a diagnosis of vitamin D deficiency. She is currently stable on vit D and is now at goal. She denies nausea, vomiting or muscle weakness.  Depression with emotional eating behaviors [name]'s mood has improved on topamax and she is doing better with decreasing emotional eating. Jaclyn Lowe struggles with emotional eating and using food for comfort to the extent that it is negatively impacting her health. She often snacks when she is not hungry. Jaclyn Lowe sometimes feels she is out of control and then feels guilty that she made poor food choices. She has been working on behavior modification techniques to help reduce her emotional eating and has been somewhat successful. She shows no sign of suicidal or homicidal ideations.  Hypertension Jaclyn Lowe is a 71 y.o. female with hypertension. Her blood pressure is stable on medications and Jaclyn Lowe denies chest pain or lightheadedness. She is working weight loss to help control her blood pressure with the goal of decreasing her risk of heart attack and stroke. Jaclyn Lowe blood pressure is currently controlled.    Depression screen Jaclyn Lowe 2/9 07/14/2016 04/21/2016  Decreased Interest 0 2  Down, Depressed, Hopeless 0 2  PHQ - 2 Score 0 4  Altered sleeping - 1  Tired, decreased energy - 3  Change in appetite - 3  Feeling bad or failure about  yourself  - 2  Trouble concentrating - 2  Moving slowly or fidgety/restless - 2  Suicidal thoughts - 0  PHQ-9 Score - 17      ALLERGIES: Allergies  Allergen Reactions  . Neosporin [Neomycin-Bacitracin Zn-Polymyx] Itching and Rash    MEDICATIONS: Current Outpatient Prescriptions on File Prior to Visit  Medication Sig Dispense Refill  . apixaban (ELIQUIS) 5 MG TABS tablet Take 5 mg by mouth 2 (two) times daily.    Marland Kitchen atorvastatin (LIPITOR) 10 MG tablet Take 10 mg by mouth daily.    . cephALEXin (KEFLEX) 500 MG capsule TAKE ONE (1) CAPSULE FOUR (4) TIMES EACH DAY 40 capsule 0  . triamcinolone cream (KENALOG) 0.5 % Apply 1 application topically 3 (three) times daily. 60 g 6   No current facility-administered medications on file prior to visit.     PAST MEDICAL HISTORY: Past Medical History:  Diagnosis Date  . A-fib ([place])   . Back pain   . Bell's palsy 2010  . Cancer ([place])    basal cell on the nose  . Edema    feet and legs  . Gallbladder disease   . HTN (hypertension)   . Hyperlipidemia   . Joint pain   . Loss of smell   . Loss of taste   . Palpitations   . Prediabetes   . Rectoceles   . SOB (shortness of breath)   . Swallowing difficulty   . Varicose  vein of leg     PAST SURGICAL HISTORY: Past Surgical History:  Procedure Laterality Date  . ANKLE SURGERY     left ankle  . AV FISTULA PLACEMENT     leg  . CHOLECYSTECTOMY    . GASTRIC BYPASS    . tummy tuck    . VEIN REPAIR      SOCIAL HISTORY: Social History  Substance Use Topics  . Smoking status: Former Research scientist (life sciences)  . Smokeless tobacco: Never Used     Comment: Only in college  . Alcohol use Yes     Comment: Occasional    FAMILY HISTORY: Family History  Problem Relation Age of Onset  . Diabetes Mother   . Hyperlipidemia Mother   . Sudden death Mother   . Obesity Mother   . Hypertension Father   . Hyperlipidemia Father   . Heart disease Father   . Sudden death Father     ROS: Review of  Systems  Constitutional: Positive for weight loss.  Cardiovascular: Negative for chest pain.  Gastrointestinal: Negative for nausea and vomiting.  Musculoskeletal:       Negative muscle weakness  Neurological:       Negative lightheadedness  Psychiatric/Behavioral: Positive for depression. Negative for suicidal ideas.    PHYSICAL EXAM: Blood pressure 127/77, pulse (!) 57, temperature 98.3 F (36.8 C), temperature source Oral, height 5\' 3"  (1.6 m), weight 191 lb (86.6 kg), SpO2 99 %. Body mass index is 33.83 kg/m. Physical Exam  Constitutional: She is oriented to person, place, and time. She appears well-developed and well-nourished.  Cardiovascular: Normal rate.   Pulmonary/Chest: Effort normal.  Musculoskeletal: Normal range of motion.  Neurological: She is oriented to person, place, and time.  Skin: Skin is warm and dry.  Psychiatric: She has a normal mood and affect. Her behavior is normal.  Vitals reviewed.   RECENT LABS AND TESTS: BMET    Component Value Date/Time   NA 145 (H) 08/19/2016 0838   K 4.1 08/19/2016 0838   CL 109 (H) 08/19/2016 0838   CO2 20 08/19/2016 0838   GLUCOSE 106 (H) 08/19/2016 0838   BUN 18 08/19/2016 0838   CREATININE 0.98 08/19/2016 0838   CALCIUM 8.9 08/19/2016 0838   GFRNONAA 59 (L) 08/19/2016 0838   GFRAA 68 08/19/2016 0838   Lab Results  Component Value Date   HGBA1C 5.5 08/19/2016   HGBA1C 5.8 (H) 04/21/2016   Lab Results  Component Value Date   INSULIN 8.0 08/19/2016   INSULIN 8.3 04/21/2016   CBC    Component Value Date/Time   WBC 5.4 04/21/2016 1157   RBC 3.87 04/21/2016 1157   HGB 11.4 04/21/2016 1157   HCT 34.7 04/21/2016 1157   PLT 227 04/21/2016 1157   MCV 90 04/21/2016 1157   MCH 29.5 04/21/2016 1157   MCHC 32.9 04/21/2016 1157   RDW 14.2 04/21/2016 1157   LYMPHSABS 0.8 04/21/2016 1157   EOSABS 0.0 04/21/2016 1157   BASOSABS 0.0 04/21/2016 1157   Iron/TIBC/Ferritin/ %Sat No results found for: IRON, TIBC,  FERRITIN, IRONPCTSAT Lipid Panel     Component Value Date/Time   CHOL 124 04/21/2016 1157   TRIG 104 04/21/2016 1157   HDL 42 04/21/2016 1157   LDLCALC 61 04/21/2016 1157   Hepatic Function Panel     Component Value Date/Time   PROT 6.2 08/19/2016 0838   ALBUMIN 4.2 08/19/2016 0838   AST 18 08/19/2016 0838   ALT 18 08/19/2016 0838   ALKPHOS 60 08/19/2016  4128   BILITOT 0.8 08/19/2016 0838      Component Value Date/Time   TSH 1.410 04/21/2016 1157    ASSESSMENT AND PLAN: Vitamin D deficiency - Plan: Vitamin D, Ergocalciferol, (DRISDOL) 50000 units CAPS capsule  Essential hypertension - Plan: lisinopril (PRINIVIL,ZESTRIL) 20 MG tablet  Other depression - Plan: topiramate (TOPAMAX) 50 MG tablet  Class 1 obesity with serious comorbidity and body mass index (BMI) of 33.0 to 33.9 in adult, unspecified obesity type  PLAN:  Vitamin D Deficiency Sharlize was informed that low vitamin D levels contributes to fatigue and are associated with obesity, breast, and colon cancer. She agrees to continue to take prescription Vit D @50 ,000 IU every week, we will refill for 1 month and will follow up for routine testing of vitamin D, at least 2-3 times per year. She was informed of the risk of over-replacement of vitamin D and agrees to not increase her dose unless he discusses this with [name] first. Torryn agrees to follow up with our clinic in 4 weeks.  Depression with Emotional Eating Behaviors We discussed behavior modification techniques today to help Deepika deal with her emotional eating and depression. She has agreed to continue topamax 50 mg bid #60 with no refills and will follow up as directed.  Hypertension We discussed sodium restriction, working on healthy weight loss, and a regular exercise program as the means to achieve improved blood pressure control. Kathreen agreed with this plan and agreed to follow up as directed. We will continue to monitor her blood pressure as well as her progress with  the above lifestyle modifications. She agrees to continue lisinopril 20 mg qd #30 with no refills and will watch for signs of hypotension as she continues her lifestyle modifications.  Obesity Josslin is currently in the action stage of change. As such, her goal is to continue with weight loss efforts She has agreed to follow the Category 2 plan Lanier has been instructed to work up to a goal of 150 minutes of combined cardio and strengthening exercise per week for weight loss and overall health benefits. We discussed the following Behavioral Modification Strategies today: no skipping meals, increasing lean protein intake and decreasing simple carbohydrates   Senai has agreed to follow up with our clinic in 4 weeks. She was informed of the importance of frequent follow up visits to maximize her success with intensive lifestyle modifications for her multiple health conditions.  I, Doreene Nest, am acting as transcriptionist for Dennard Nip, MD  I have reviewed the above documentation for accuracy and completeness, and I agree with the above. -Dennard Nip, MD   OBESITY BEHAVIORAL INTERVENTION VISIT  Today's visit was # 10 out of 22.  Starting weight: 222 lbs Starting date: 04/21/16 Today's weight : 191 lbs Today's date: 10/07/2016 Total lbs lost to date: 23 (Patients must lose 7 lbs in the first 6 months to continue with counseling)   ASK: We discussed the diagnosis of obesity with [name] today and Stanton Kidney agreed to give [name] permission to discuss obesity behavioral modification therapy today.  ASSESS: Lorri has the diagnosis of obesity and her BMI today is 33.9 Benny is in the action stage of change   ADVISE: Iza was educated on the multiple health risks of obesity as well as the benefit of weight loss to improve her health. She was advised of the need for long term treatment and the importance of lifestyle modifications.  AGREE: Multiple dietary modification options and treatment  options were  discussed and  Correna agreed to follow the Category 2 plan We discussed the following Behavioral Modification Strategies today: no skipping meals, increasing lean protein intake and decreasing simple carbohydrates

## 2016-11-04 ENCOUNTER — Ambulatory Visit (INDEPENDENT_AMBULATORY_CARE_PROVIDER_SITE_OTHER): Payer: Managed Care, Other (non HMO) | Admitting: Family Medicine

## 2016-11-08 ENCOUNTER — Ambulatory Visit (INDEPENDENT_AMBULATORY_CARE_PROVIDER_SITE_OTHER): Payer: Managed Care, Other (non HMO) | Admitting: Physician Assistant

## 2016-11-08 VITALS — BP 134/60 | HR 64 | Temp 98.1°F | Ht 63.0 in | Wt 187.0 lb

## 2016-11-08 DIAGNOSIS — Z6833 Body mass index (BMI) 33.0-33.9, adult: Secondary | ICD-10-CM

## 2016-11-08 DIAGNOSIS — I10 Essential (primary) hypertension: Secondary | ICD-10-CM | POA: Diagnosis not present

## 2016-11-08 DIAGNOSIS — F3289 Other specified depressive episodes: Secondary | ICD-10-CM | POA: Diagnosis not present

## 2016-11-08 DIAGNOSIS — E669 Obesity, unspecified: Secondary | ICD-10-CM | POA: Diagnosis not present

## 2016-11-08 MED ORDER — TOPIRAMATE 50 MG PO TABS: 50.0000 mg | ORAL_TABLET | Freq: Two times a day (BID) | ORAL | 0 refills | Status: DC

## 2016-11-08 MED ORDER — LISINOPRIL 20 MG PO TABS: 20.0000 mg | ORAL_TABLET | Freq: Every day | ORAL | 0 refills | Status: DC

## 2016-11-08 NOTE — Progress Notes (Signed)
Office: [phone]  /  Fax: [phone]   HPI:   Chief Complaint: OBESITY Jaclyn Lowe is here to discuss her progress with her obesity treatment plan. She is on the Category 2 plan and is following her eating plan approximately 90 % of the time. She states she is exercising 0 minutes 0 times per week. [place] continues to do well with weight loss. She was on vacation and gained weight but has been back on track and has done great with weight loss. Hunger is well controlled. Her weight is 187 lb (84.8 kg) today and has had a weight loss of 4 pounds over a period of 4 weeks since her last visit. She has lost 35 lbs since starting treatment with [name].  Hypertension Jaclyn Lowe is a 71 y.o. female with hypertension.  Jaclyn Lowe denies chest pain or shortness of breath on exertion. She is working weight loss to help control her blood pressure with the goal of decreasing her risk of heart attack and stroke. Jaclyn Lowe blood pressure is currently stable.  Depression with emotional eating behaviors Jaclyn Lowe is struggling with emotional eating and using food for comfort to the extent that it is negatively impacting her health. She often snacks when she is not hungry. Jaclyn Lowe sometimes feels she is out of control and then feels guilty that she made poor food choices. She has been working on behavior modification techniques to help reduce her emotional eating and has been somewhat successful. Her mood is stable and she shows no sign of suicidal or homicidal ideations.  Depression screen [facility] 2/9 07/14/2016 04/21/2016  Decreased Interest 0 2  Down, Depressed, Hopeless 0 2  PHQ - 2 Score 0 4  Altered sleeping - 1  Tired, decreased energy - 3  Change in appetite - 3  Feeling bad or failure about yourself  - 2  Trouble concentrating - 2  Moving slowly or fidgety/restless - 2  Suicidal thoughts - 0  PHQ-9 Score - 17      ALLERGIES: Allergies  Allergen Reactions  . Neosporin [Neomycin-Bacitracin Zn-Polymyx] Itching  and Rash    MEDICATIONS: Current Outpatient Prescriptions on File Prior to Visit  Medication Sig Dispense Refill  . apixaban (ELIQUIS) 5 MG TABS tablet Take 5 mg by mouth 2 (two) times daily.    Marland Kitchen atorvastatin (LIPITOR) 10 MG tablet Take 10 mg by mouth daily.    Marland Kitchen lisinopril (PRINIVIL,ZESTRIL) 20 MG tablet Take 1 tablet (20 mg total) by mouth daily. 30 tablet 0  . topiramate (TOPAMAX) 50 MG tablet Take 1 tablet (50 mg total) by mouth 2 (two) times daily. 60 tablet 0  . triamcinolone cream (KENALOG) 0.5 % Apply 1 application topically 3 (three) times daily. 60 g 6  . Vitamin D, Ergocalciferol, (DRISDOL) 50000 units CAPS capsule Take 1 capsule (50,000 Units total) by mouth every 7 (seven) days. 4 capsule 0  . cephALEXin (KEFLEX) 500 MG capsule TAKE ONE (1) CAPSULE FOUR (4) TIMES EACH DAY (Patient not taking: Reported on 11/08/2016) 40 capsule 0   No current facility-administered medications on file prior to visit.     PAST MEDICAL HISTORY: Past Medical History:  Diagnosis Date  . A-fib ([place])   . Back pain   . Bell's palsy 2010  . Cancer ([place])    basal cell on the nose  . Edema    feet and legs  . Gallbladder disease   . HTN (hypertension)   . Hyperlipidemia   . Joint pain   . Loss  of smell   . Loss of taste   . Palpitations   . Prediabetes   . Rectoceles   . SOB (shortness of breath)   . Swallowing difficulty   . Varicose vein of leg     PAST SURGICAL HISTORY: Past Surgical History:  Procedure Laterality Date  . ANKLE SURGERY     left ankle  . AV FISTULA PLACEMENT     leg  . CHOLECYSTECTOMY    . GASTRIC BYPASS    . tummy tuck    . VEIN REPAIR      SOCIAL HISTORY: Social History  Substance Use Topics  . Smoking status: Former Research scientist (life sciences)  . Smokeless tobacco: Never Used     Comment: Only in college  . Alcohol use Yes     Comment: Occasional    FAMILY HISTORY: Family History  Problem Relation Age of Onset  . Diabetes Mother   . Hyperlipidemia Mother   .  Sudden death Mother   . Obesity Mother   . Hypertension Father   . Hyperlipidemia Father   . Heart disease Father   . Sudden death Father     ROS: Review of Systems  Constitutional: Positive for weight loss.  Respiratory: Negative for shortness of breath (on exertion).   Cardiovascular: Negative for chest pain.  Psychiatric/Behavioral: Positive for depression. Negative for suicidal ideas.    PHYSICAL EXAM: Blood pressure 134/60, pulse 64, temperature 98.1 F (36.7 C), temperature source Oral, height 5\' 3"  (1.6 m), weight 187 lb (84.8 kg), SpO2 100 %. Body mass index is 33.13 kg/m. Physical Exam  Constitutional: She is oriented to person, place, and time. She appears well-developed and well-nourished.  Cardiovascular: Normal rate.   Pulmonary/Chest: Effort normal.  Musculoskeletal: Normal range of motion.  Neurological: She is oriented to person, place, and time.  Skin: Skin is warm and dry.  Psychiatric: She has a normal mood and affect. Her behavior is normal.  Vitals reviewed.   RECENT LABS AND TESTS: BMET    Component Value Date/Time   NA 145 (H) 08/19/2016 0838   K 4.1 08/19/2016 0838   CL 109 (H) 08/19/2016 0838   CO2 20 08/19/2016 0838   GLUCOSE 106 (H) 08/19/2016 0838   BUN 18 08/19/2016 0838   CREATININE 0.98 08/19/2016 0838   CALCIUM 8.9 08/19/2016 0838   GFRNONAA 59 (L) 08/19/2016 0838   GFRAA 68 08/19/2016 0838   Lab Results  Component Value Date   HGBA1C 5.5 08/19/2016   HGBA1C 5.8 (H) 04/21/2016   Lab Results  Component Value Date   INSULIN 8.0 08/19/2016   INSULIN 8.3 04/21/2016   CBC    Component Value Date/Time   WBC 5.4 04/21/2016 1157   RBC 3.87 04/21/2016 1157   HGB 11.4 04/21/2016 1157   HCT 34.7 04/21/2016 1157   PLT 227 04/21/2016 1157   MCV 90 04/21/2016 1157   MCH 29.5 04/21/2016 1157   MCHC 32.9 04/21/2016 1157   RDW 14.2 04/21/2016 1157   LYMPHSABS 0.8 04/21/2016 1157   EOSABS 0.0 04/21/2016 1157   BASOSABS 0.0  04/21/2016 1157   Iron/TIBC/Ferritin/ %Sat No results found for: IRON, TIBC, FERRITIN, IRONPCTSAT Lipid Panel     Component Value Date/Time   CHOL 124 04/21/2016 1157   TRIG 104 04/21/2016 1157   HDL 42 04/21/2016 1157   LDLCALC 61 04/21/2016 1157   Hepatic Function Panel     Component Value Date/Time   PROT 6.2 08/19/2016 0838   ALBUMIN 4.2 08/19/2016 9381  AST 18 08/19/2016 0838   ALT 18 08/19/2016 0838   ALKPHOS 60 08/19/2016 0838   BILITOT 0.8 08/19/2016 0838      Component Value Date/Time   TSH 1.410 04/21/2016 1157    ASSESSMENT AND PLAN: Essential hypertension - Plan: lisinopril (PRINIVIL,ZESTRIL) 20 MG tablet  Other depression - Plan: topiramate (TOPAMAX) 50 MG tablet  Class 1 obesity with serious comorbidity and body mass index (BMI) of 33.0 to 33.9 in adult, unspecified obesity type  PLAN:  Hypertension We discussed sodium restriction, working on healthy weight loss, and a regular exercise program as the means to achieve improved blood pressure control. Charmine agreed with this plan and agreed to follow up as directed. We will continue to monitor her blood pressure as well as her progress with the above lifestyle modifications. She agrees to continue Lisinopril 20 mg qd #30 with no refills and will watch for signs of hypotension as she continues her lifestyle modifications.   Depression with Emotional Eating Behaviors We discussed behavior modification techniques today to help Jaclyn Lowe deal with her emotional eating and depression. She has agreed to continue Topiramate 50 mg bid #60 with no refills and will follow up as directed.  Obesity Jaclyn Lowe is currently in the action stage of change. As such, her goal is to continue with weight loss efforts She has agreed to follow the Category 2 plan Jaclyn Lowe has been instructed to work up to a goal of 150 minutes of combined cardio and strengthening exercise per week for weight loss and overall health benefits. We discussed the  following Behavioral Modification Strategies today: increasing lean protein intake and work on meal planning and easy cooking plans  Jaclyn Lowe has agreed to follow up with our clinic in 4 weeks. She was informed of the importance of frequent follow up visits to maximize her success with intensive lifestyle modifications for her multiple health conditions.  I, Jaclyn Lowe, am acting as transcriptionist for Jaclyn Duverney, PA-C  I have reviewed the above documentation for accuracy and completeness, and I agree with the above. -Jaclyn Duverney, PA-C  I have reviewed the above note and agree with the plan. -Dennard Nip, MD   OBESITY BEHAVIORAL INTERVENTION VISIT  Today's visit was # 11 out of 22.  Starting weight: 222 lbs Starting date: 04/21/16 Today's weight : 187 lbs Today's date: 11/08/2016 Total lbs lost to date: 28 (Patients must lose 7 lbs in the first 6 months to continue with counseling)   ASK: We discussed the diagnosis of obesity with Jaclyn Lowe today and Jaclyn Lowe agreed to give [name] permission to discuss obesity behavioral modification therapy today.  ASSESS: Jaclyn Lowe has the diagnosis of obesity and her BMI today is 33.13 Jaclyn Lowe is in the action stage of change   ADVISE: Jaclyn Lowe was educated on the multiple health risks of obesity as well as the benefit of weight loss to improve her health. She was advised of the need for long term treatment and the importance of lifestyle modifications.  AGREE: Multiple dietary modification options and treatment options were discussed and  Jaclyn Lowe agreed to follow the Category 2 plan We discussed the following Behavioral Modification Strategies today: increasing lean protein intake and work on meal planning and easy cooking plans

## 2016-11-11 ENCOUNTER — Other Ambulatory Visit (INDEPENDENT_AMBULATORY_CARE_PROVIDER_SITE_OTHER): Payer: Self-pay | Admitting: Family Medicine

## 2016-11-11 DIAGNOSIS — E559 Vitamin D deficiency, unspecified: Secondary | ICD-10-CM

## 2016-12-06 ENCOUNTER — Encounter (INDEPENDENT_AMBULATORY_CARE_PROVIDER_SITE_OTHER): Payer: Self-pay

## 2016-12-06 ENCOUNTER — Ambulatory Visit (INDEPENDENT_AMBULATORY_CARE_PROVIDER_SITE_OTHER): Payer: Managed Care, Other (non HMO) | Admitting: Physician Assistant

## 2016-12-08 ENCOUNTER — Ambulatory Visit (INDEPENDENT_AMBULATORY_CARE_PROVIDER_SITE_OTHER): Payer: Managed Care, Other (non HMO) | Admitting: Physician Assistant

## 2016-12-08 VITALS — BP 140/63 | HR 56 | Temp 97.5°F | Ht 63.0 in | Wt 183.0 lb

## 2016-12-08 DIAGNOSIS — I10 Essential (primary) hypertension: Secondary | ICD-10-CM | POA: Diagnosis not present

## 2016-12-08 DIAGNOSIS — E669 Obesity, unspecified: Secondary | ICD-10-CM

## 2016-12-08 DIAGNOSIS — Z6832 Body mass index (BMI) 32.0-32.9, adult: Secondary | ICD-10-CM | POA: Diagnosis not present

## 2016-12-08 DIAGNOSIS — F3289 Other specified depressive episodes: Secondary | ICD-10-CM

## 2016-12-08 MED ORDER — TOPIRAMATE 50 MG PO TABS: 50.0000 mg | ORAL_TABLET | Freq: Two times a day (BID) | ORAL | 0 refills | Status: DC

## 2016-12-08 MED ORDER — LISINOPRIL 20 MG PO TABS: 20.0000 mg | ORAL_TABLET | Freq: Every day | ORAL | 0 refills | Status: DC

## 2016-12-09 NOTE — Progress Notes (Signed)
Office: [phone]  /  Fax: [phone]   HPI:   Chief Complaint: OBESITY Jaclyn Lowe is here to discuss her progress with her obesity treatment plan. She is on the Category 2 plan and is following her eating plan approximately 90 % of the time. She states she is walking for 20 minutes 2 times per week. Jaclyn Lowe continues to do well with weight loss. She continues to be mindful of her eating. She makes smarter food choices and controls her portions. Her weight is 183 lb (83 kg) today and has had a weight loss of 4 pounds over a period of 4 weeks since her last visit. She has lost 39 lbs since starting treatment with [name].  Hypertension Jaclyn Lowe is a 71 y.o. female with hypertension Her blood pressure is elevated and Jaclyn Lowe denies chest pain or shortness of breath on exertion. She is working weight loss to help control her blood pressure with the goal of decreasing her risk of heart attack and stroke. [name] blood pressure is not currently controlled.  Depression with emotional eating behaviors Jaclyn Lowe is struggling with emotional eating and using food for comfort to the extent that it is negatively impacting her health. She often snacks when she is not hungry. Jaclyn Lowe sometimes feels she is out of control and then feels guilty that she made poor food choices. She has been working on behavior modification techniques to help reduce her emotional eating and has been somewhat successful. Her mood is stable and she shows no sign of suicidal or homicidal ideations.  Depression screen [facility] 2/9 07/14/2016 04/21/2016  Decreased Interest 0 2  Down, Depressed, Hopeless 0 2  PHQ - 2 Score 0 4  Altered sleeping - 1  Tired, decreased energy - 3  Change in appetite - 3  Feeling bad or failure about yourself  - 2  Trouble concentrating - 2  Moving slowly or fidgety/restless - 2  Suicidal thoughts - 0  PHQ-9 Score - 17      ALLERGIES: Allergies  Allergen Reactions  . Neosporin [Neomycin-Bacitracin  Zn-Polymyx] Itching and Rash    MEDICATIONS: Current Outpatient Prescriptions on File Prior to Visit  Medication Sig Dispense Refill  . apixaban (ELIQUIS) 5 MG TABS tablet Take 5 mg by mouth 2 (two) times daily.    Marland Kitchen atorvastatin (LIPITOR) 10 MG tablet Take 10 mg by mouth daily.    . cephALEXin (KEFLEX) 500 MG capsule TAKE ONE (1) CAPSULE FOUR (4) TIMES EACH DAY 40 capsule 0  . triamcinolone cream (KENALOG) 0.5 % Apply 1 application topically 3 (three) times daily. 60 g 6  . Vitamin D, Ergocalciferol, (DRISDOL) 50000 units CAPS capsule TAKE ONE CAPSULE BY MOUTH EVERY 7 DAYS 4 capsule 0   No current facility-administered medications on file prior to visit.     PAST MEDICAL HISTORY: Past Medical History:  Diagnosis Date  . A-fib ([place])   . Back pain   . Bell's palsy 2010  . Cancer ([place])    basal cell on the nose  . Edema    feet and legs  . Gallbladder disease   . HTN (hypertension)   . Hyperlipidemia   . Joint pain   . Loss of smell   . Loss of taste   . Palpitations   . Prediabetes   . Rectoceles   . SOB (shortness of breath)   . Swallowing difficulty   . Varicose vein of leg     PAST SURGICAL HISTORY: Past Surgical History:  Procedure  Laterality Date  . ANKLE SURGERY     left ankle  . AV FISTULA PLACEMENT     leg  . CHOLECYSTECTOMY    . GASTRIC BYPASS    . tummy tuck    . VEIN REPAIR      SOCIAL HISTORY: Social History  Substance Use Topics  . Smoking status: Former Research scientist (life sciences)  . Smokeless tobacco: Never Used     Comment: Only in college  . Alcohol use Yes     Comment: Occasional    FAMILY HISTORY: Family History  Problem Relation Age of Onset  . Diabetes Mother   . Hyperlipidemia Mother   . Sudden death Mother   . Obesity Mother   . Hypertension Father   . Hyperlipidemia Father   . Heart disease Father   . Sudden death Father     ROS: Review of Systems  Constitutional: Positive for weight loss.  Respiratory: Negative for shortness of breath  (on exertion).   Cardiovascular: Negative for chest pain.  Psychiatric/Behavioral: Positive for depression. Negative for suicidal ideas.    PHYSICAL EXAM: Blood pressure 140/63, pulse (!) 56, temperature (!) 97.5 F (36.4 C), temperature source Oral, height 5\' 3"  (1.6 m), weight 183 lb (83 kg), SpO2 99 %. Body mass index is 32.42 kg/m. Physical Exam  Constitutional: She is oriented to person, place, and time. She appears well-developed and well-nourished.  Cardiovascular:  Bradycardic  Pulmonary/Chest: Effort normal.  Musculoskeletal: Normal range of motion.  Neurological: She is oriented to person, place, and time.  Skin: Skin is warm and dry.  Psychiatric: She has a normal mood and affect. Her behavior is normal.  Vitals reviewed.   RECENT LABS AND TESTS: BMET    Component Value Date/Time   NA 145 (H) 08/19/2016 0838   K 4.1 08/19/2016 0838   CL 109 (H) 08/19/2016 0838   CO2 20 08/19/2016 0838   GLUCOSE 106 (H) 08/19/2016 0838   BUN 18 08/19/2016 0838   CREATININE 0.98 08/19/2016 0838   CALCIUM 8.9 08/19/2016 0838   GFRNONAA 59 (L) 08/19/2016 0838   GFRAA 68 08/19/2016 0838   Lab Results  Component Value Date   HGBA1C 5.5 08/19/2016   HGBA1C 5.8 (H) 04/21/2016   Lab Results  Component Value Date   INSULIN 8.0 08/19/2016   INSULIN 8.3 04/21/2016   CBC    Component Value Date/Time   WBC 5.4 04/21/2016 1157   RBC 3.87 04/21/2016 1157   HGB 11.4 04/21/2016 1157   HCT 34.7 04/21/2016 1157   PLT 227 04/21/2016 1157   MCV 90 04/21/2016 1157   MCH 29.5 04/21/2016 1157   MCHC 32.9 04/21/2016 1157   RDW 14.2 04/21/2016 1157   LYMPHSABS 0.8 04/21/2016 1157   EOSABS 0.0 04/21/2016 1157   BASOSABS 0.0 04/21/2016 1157   Iron/TIBC/Ferritin/ %Sat No results found for: IRON, TIBC, FERRITIN, IRONPCTSAT Lipid Panel     Component Value Date/Time   CHOL 124 04/21/2016 1157   TRIG 104 04/21/2016 1157   HDL 42 04/21/2016 1157   LDLCALC 61 04/21/2016 1157   Hepatic  Function Panel     Component Value Date/Time   PROT 6.2 08/19/2016 0838   ALBUMIN 4.2 08/19/2016 0838   AST 18 08/19/2016 0838   ALT 18 08/19/2016 0838   ALKPHOS 60 08/19/2016 0838   BILITOT 0.8 08/19/2016 0838      Component Value Date/Time   TSH 1.410 04/21/2016 1157    ASSESSMENT AND PLAN: Essential hypertension - Plan: lisinopril (PRINIVIL,ZESTRIL)  20 MG tablet  Other depression - with emitonal eating - Plan: topiramate (TOPAMAX) 50 MG tablet  Class 1 obesity with serious comorbidity and body mass index (BMI) of 32.0 to 32.9 in adult, unspecified obesity type  PLAN:  Hypertension We discussed sodium restriction, working on healthy weight loss, and a regular exercise program as the means to achieve improved blood pressure control. [place] agreed with this plan and agreed to follow up as directed. We will continue to monitor her blood pressure as well as her progress with the above lifestyle modifications. She agrees to continue lisinopril 20 mg #30 with no refills and will watch for signs of hypotension as she continues her lifestyle modifications. She is advised to keep a BP log and bring in for review at next visit for possible change in medicine.  Depression with Emotional Eating Behaviors We discussed behavior modification techniques today to help Jaclyn Lowe deal with her emotional eating and depression. She has agreed to continue Topamax 50 mg bid #60 with no refills and will follow up as directed.  Obesity Jaclyn Lowe is currently in the action stage of change. As such, her goal is to continue with weight loss efforts She has agreed to follow the Category 2 plan Jaclyn Lowe has been instructed to work up to a goal of 150 minutes of combined cardio and strengthening exercise per week for weight loss and overall health benefits. We discussed the following Behavioral Modification Strategies today: increasing lean protein intake and keeping healthy foods in the home  Jaclyn Lowe has agreed to follow up  with our clinic in 4 weeks. She was informed of the importance of frequent follow up visits to maximize her success with intensive lifestyle modifications for her multiple health conditions.  I, Doreene Nest, am acting as transcriptionist for Lacy Duverney, PA-C  I have reviewed the above documentation for accuracy and completeness, and I agree with the above. -Lacy Duverney, PA-C  I have reviewed the above note and agree with the plan. -Dennard Nip, MD   OBESITY BEHAVIORAL INTERVENTION VISIT  Today's visit was # 12 out of 22.  Starting weight: 222 lbs Starting date: 04/21/16 Today's weight : 183 lbs Today's date: 12/08/2016 Total lbs lost to date: 3 (Patients must lose 7 lbs in the first 6 months to continue with counseling)   ASK: We discussed the diagnosis of obesity with Jaclyn Lowe today and Jaclyn Lowe agreed to give [name] permission to discuss obesity behavioral modification therapy today.  ASSESS: Jaclyn Lowe has the diagnosis of obesity and her BMI today is 32.42 Jaclyn Lowe is in the action stage of change   ADVISE: Jaclyn Lowe was educated on the multiple health risks of obesity as well as the benefit of weight loss to improve her health. She was advised of the need for long term treatment and the importance of lifestyle modifications.  AGREE: Multiple dietary modification options and treatment options were discussed and  Jaclyn Lowe agreed to follow the Category 2 plan We discussed the following Behavioral Modification Strategies today: increasing lean protein intake and keeping healthy foods in the home

## 2017-01-03 ENCOUNTER — Other Ambulatory Visit (INDEPENDENT_AMBULATORY_CARE_PROVIDER_SITE_OTHER): Payer: Managed Care, Other (non HMO) | Admitting: *Deleted

## 2017-01-03 ENCOUNTER — Ambulatory Visit (INDEPENDENT_AMBULATORY_CARE_PROVIDER_SITE_OTHER): Payer: Managed Care, Other (non HMO) | Admitting: *Deleted

## 2017-01-03 ENCOUNTER — Ambulatory Visit (INDEPENDENT_AMBULATORY_CARE_PROVIDER_SITE_OTHER): Payer: Managed Care, Other (non HMO) | Admitting: Physician Assistant

## 2017-01-03 VITALS — BP 137/78 | HR 64 | Temp 98.5°F | Ht 63.0 in | Wt 178.0 lb

## 2017-01-03 DIAGNOSIS — Z23 Encounter for immunization: Secondary | ICD-10-CM | POA: Diagnosis not present

## 2017-01-03 DIAGNOSIS — Z6831 Body mass index (BMI) 31.0-31.9, adult: Secondary | ICD-10-CM | POA: Diagnosis not present

## 2017-01-03 DIAGNOSIS — R7303 Prediabetes: Secondary | ICD-10-CM

## 2017-01-03 DIAGNOSIS — E7849 Other hyperlipidemia: Secondary | ICD-10-CM | POA: Diagnosis not present

## 2017-01-03 DIAGNOSIS — I10 Essential (primary) hypertension: Secondary | ICD-10-CM | POA: Diagnosis not present

## 2017-01-03 DIAGNOSIS — E559 Vitamin D deficiency, unspecified: Secondary | ICD-10-CM | POA: Diagnosis not present

## 2017-01-03 DIAGNOSIS — E669 Obesity, unspecified: Secondary | ICD-10-CM | POA: Diagnosis not present

## 2017-01-03 DIAGNOSIS — F3289 Other specified depressive episodes: Secondary | ICD-10-CM | POA: Diagnosis not present

## 2017-01-03 DIAGNOSIS — E785 Hyperlipidemia, unspecified: Secondary | ICD-10-CM | POA: Diagnosis not present

## 2017-01-03 MED ORDER — TOPIRAMATE 50 MG PO TABS: 50.0000 mg | ORAL_TABLET | Freq: Two times a day (BID) | ORAL | 0 refills | Status: DC

## 2017-01-03 MED ORDER — LISINOPRIL 20 MG PO TABS: 20.0000 mg | ORAL_TABLET | Freq: Every day | ORAL | 0 refills | Status: DC

## 2017-01-03 MED ORDER — VITAMIN D (ERGOCALCIFEROL) 1.25 MG (50000 UNIT) PO CAPS: 50000.0000 [IU] | ORAL_CAPSULE | ORAL | 0 refills | Status: DC

## 2017-01-03 NOTE — Progress Notes (Signed)
Office: [phone]  /  Fax: [phone]   HPI:   Chief Complaint: OBESITY Jaclyn Lowe is here to discuss her progress with her obesity treatment plan. She is on the Category 2 plan and is following her eating plan approximately 75 % of the time. She states she is exercising 20 to 30 minutes 5 to 6 times per week. Jaclyn Lowe continues to do well with weight loss. She plans her meals well and states hunger is well controlled. She would like more meal planning ideas. Her weight is 178 lb (80.7 kg) today and has had a weight loss of 5 pounds over a period of 4 weeks since her last visit. She has lost 44 lbs since starting treatment with [name].  Vitamin D deficiency Jaclyn Lowe has a diagnosis of vitamin D deficiency. She is currently taking vit D and denies nausea, vomiting or muscle weakness.  Hypertension Jaclyn Lowe is a 71 y.o. female with hypertension.  Gizella [place] denies chest pain or shortness of breath on exertion. She is working weight loss to help control her blood pressure with the goal of decreasing her risk of heart attack and stroke. Jaclyn Lowe blood pressure is currently stable.  Hyperlipidemia Jaclyn Lowe has hyperlipidemia and is currently on lipitor. She has been trying to improve her cholesterol levels with intensive lifestyle modification including a low saturated fat diet, exercise and weight loss. She denies any chest pain, claudication or myalgias.  Pre-Diabetes Jaclyn Lowe has a diagnosis of pre-diabetes based on her elevated Hgb A1c and was informed this puts her at greater risk of developing diabetes. She is not taking metformin currently and continues to work on diet and exercise to decrease risk of diabetes. She denies nausea, polyphagia or hypoglycemia.  Depression with emotional eating behaviors Jaclyn Lowe is struggling with emotional eating and using food for comfort to the extent that it is negatively impacting her health. She often snacks when she is not hungry. Jaclyn Lowe sometimes feels she is out of control  and then feels guilty that she made poor food choices. She has been working on behavior modification techniques to help reduce her emotional eating and has been somewhat successful. Her mood is stable and she shows no sign of suicidal or homicidal ideations.  Depression screen Jaclyn Lowe 2/9 07/14/2016 04/21/2016  Decreased Interest 0 2  Down, Depressed, Hopeless 0 2  PHQ - 2 Score 0 4  Altered sleeping - 1  Tired, decreased energy - 3  Change in appetite - 3  Feeling bad or failure about yourself  - 2  Trouble concentrating - 2  Moving slowly or fidgety/restless - 2  Suicidal thoughts - 0  PHQ-9 Score - 17      ALLERGIES: Allergies  Allergen Reactions   Neosporin [Neomycin-Bacitracin Zn-Polymyx] Itching and Rash    MEDICATIONS: Current Outpatient Medications on File Prior to Visit  Medication Sig Dispense Refill   apixaban (ELIQUIS) 5 MG TABS tablet Take 5 mg by mouth 2 (two) times daily.     atorvastatin (LIPITOR) 10 MG tablet Take 10 mg by mouth daily.     lisinopril (PRINIVIL,ZESTRIL) 20 MG tablet Take 1 tablet (20 mg total) by mouth daily. 30 tablet 0   topiramate (TOPAMAX) 50 MG tablet Take 1 tablet (50 mg total) by mouth 2 (two) times daily. 60 tablet 0   triamcinolone cream (KENALOG) 0.5 % Apply 1 application topically 3 (three) times daily. 60 g 6   Vitamin D, Ergocalciferol, (DRISDOL) 50000 units CAPS capsule TAKE ONE CAPSULE BY MOUTH EVERY 7  DAYS 4 capsule 0   No current facility-administered medications on file prior to visit.     PAST MEDICAL HISTORY: Past Medical History:  Diagnosis Date   A-fib ([place])    Back pain    Bell's palsy 2010   Cancer ([place])    basal cell on the nose   Edema    feet and legs   Gallbladder disease    HTN (hypertension)    Hyperlipidemia    Joint pain    Loss of smell    Loss of taste    Palpitations    Prediabetes    Rectoceles    SOB (shortness of breath)    Swallowing difficulty    Varicose vein of leg       PAST SURGICAL HISTORY: Past Surgical History:  Procedure Laterality Date   ANKLE SURGERY     left ankle   AV FISTULA PLACEMENT     leg   CHOLECYSTECTOMY     GASTRIC BYPASS     tummy tuck     VEIN REPAIR      SOCIAL HISTORY: Social History   Tobacco Use   Smoking status: Former Smoker   Smokeless tobacco: Never Used   Tobacco comment: Only in college  Substance Use Topics   Alcohol use: Yes    Comment: Occasional   Drug use: No    FAMILY HISTORY: Family History  Problem Relation Age of Onset   Diabetes Mother    Hyperlipidemia Mother    Sudden death Mother    Obesity Mother    Hypertension Father    Hyperlipidemia Father    Heart disease Father    Sudden death Father     ROS: Review of Systems  Constitutional: Positive for weight loss.  Respiratory: Negative for shortness of breath (on exertion).   Cardiovascular: Negative for chest pain and claudication.  Gastrointestinal: Negative for nausea and vomiting.  Musculoskeletal: Negative for myalgias.       Negative muscle weakness  Endo/Heme/Allergies:       Negative polyphagia Negative hypoglycemia  Psychiatric/Behavioral: Positive for depression. Negative for suicidal ideas.    PHYSICAL EXAM: Blood pressure 137/78, pulse 64, temperature 98.5 F (36.9 C), temperature source Oral, height 5\' 3"  (1.6 m), weight 178 lb (80.7 kg), SpO2 95 %. Body mass index is 31.53 kg/m. Physical Exam  Constitutional: She is oriented to person, place, and time. She appears well-developed and well-nourished.  Cardiovascular: Normal rate.  Pulmonary/Chest: Effort normal.  Musculoskeletal: Normal range of motion.  Neurological: She is oriented to person, place, and time.  Skin: Skin is warm and dry.  Psychiatric: She has a normal mood and affect. Her behavior is normal.  Vitals reviewed.   RECENT LABS AND TESTS: BMET    Component Value Date/Time   NA 145 (H) 08/19/2016 0838   K 4.1 08/19/2016  0838   CL 109 (H) 08/19/2016 0838   CO2 20 08/19/2016 0838   GLUCOSE 106 (H) 08/19/2016 0838   BUN 18 08/19/2016 0838   CREATININE 0.98 08/19/2016 0838   CALCIUM 8.9 08/19/2016 0838   GFRNONAA 59 (L) 08/19/2016 0838   GFRAA 68 08/19/2016 0838   Lab Results  Component Value Date   HGBA1C 5.5 08/19/2016   HGBA1C 5.8 (H) 04/21/2016   Lab Results  Component Value Date   INSULIN 8.0 08/19/2016   INSULIN 8.3 04/21/2016   CBC    Component Value Date/Time   WBC 5.4 04/21/2016 1157   RBC 3.87 04/21/2016 1157  HGB 11.4 04/21/2016 1157   HCT 34.7 04/21/2016 1157   PLT 227 04/21/2016 1157   MCV 90 04/21/2016 1157   MCH 29.5 04/21/2016 1157   MCHC 32.9 04/21/2016 1157   RDW 14.2 04/21/2016 1157   LYMPHSABS 0.8 04/21/2016 1157   EOSABS 0.0 04/21/2016 1157   BASOSABS 0.0 04/21/2016 1157   Iron/TIBC/Ferritin/ %Sat No results found for: IRON, TIBC, FERRITIN, IRONPCTSAT Lipid Panel     Component Value Date/Time   CHOL 124 04/21/2016 1157   TRIG 104 04/21/2016 1157   HDL 42 04/21/2016 1157   LDLCALC 61 04/21/2016 1157   Hepatic Function Panel     Component Value Date/Time   PROT 6.2 08/19/2016 0838   ALBUMIN 4.2 08/19/2016 0838   AST 18 08/19/2016 0838   ALT 18 08/19/2016 0838   ALKPHOS 60 08/19/2016 0838   BILITOT 0.8 08/19/2016 0838      Component Value Date/Time   TSH 1.410 04/21/2016 1157    ASSESSMENT AND PLAN: Essential hypertension - Plan: Lipid Panel With LDL/HDL Ratio, lisinopril (PRINIVIL,ZESTRIL) 20 MG tablet  Vitamin D deficiency - Plan: VITAMIN D 25 Hydroxy (Vit-D Deficiency, Fractures), Vitamin D, Ergocalciferol, (DRISDOL) 50000 units CAPS capsule  Prediabetes - Plan: Comprehensive metabolic panel, Hemoglobin A1c, Insulin, random, Lipid Panel With LDL/HDL Ratio  Other hyperlipidemia  Other depression - with emotional eating - Plan: topiramate (TOPAMAX) 50 MG tablet  Class 1 obesity with serious comorbidity and body mass index (BMI) of 31.0 to 31.9  in adult, unspecified obesity type  PLAN:  Vitamin D Deficiency Jaclyn Lowe was informed that low vitamin D levels contributes to fatigue and are associated with obesity, breast, and colon cancer. She agrees to continue to take prescription Vit D @50 ,000 IU every week #4 with no refills. We will check labs and will follow up for routine testing of vitamin D, at least 2-3 times per year. She was informed of the risk of over-replacement of vitamin D and agrees to not increase her dose unless he discusses this with [name] first. Jaclyn Lowe agrees to follow up with our clinic in 4 weeks.  Hypertension We discussed sodium restriction, working on healthy weight loss, and a regular exercise program as the means to achieve improved blood pressure control. Jaclyn Lowe agreed with this plan and agreed to follow up as directed. We will check labs and will continue to monitor her blood pressure as well as her progress with the above lifestyle modifications. She agrees to continue lisinopril 20 mg #30 with no refills and will watch for signs of hypotension as she continues her lifestyle modifications.  Hyperlipidemia Jaclyn Lowe was informed of the American Heart Association Guidelines emphasizing intensive lifestyle modifications as the first line treatment for hyperlipidemia. We discussed many lifestyle modifications today in depth, and Jaclyn Lowe will continue to work on decreasing saturated fats such as fatty red meat, butter and many fried foods. She will also increase vegetables and lean protein in her diet and continue to work on exercise and weight loss efforts. We will check labs and Jaclyn Lowe will continue her medications as prescribed . Jaclyn Lowe agreed to follow up at the agreed upon time.  Pre-Diabetes Jaclyn Lowe will continue to work on weight loss, exercise, and decreasing simple carbohydrates in her diet to help decrease the risk of diabetes. We dicussed metformin including benefits and risks. She was informed that eating too many simple carbohydrates  or too many calories at one sitting increases the likelihood of GI side effects. Alahni declined metformin today. We will check  labs and she agreed to follow up with [name] as directed to monitor her progress.  Depression with Emotional Eating Behaviors We discussed behavior modification techniques today to help Jaclyn Lowe deal with her emotional eating and depression. She has agreed to continue Topiramate 50 mg bid #60 with no refills and will follow up as directed.  Obesity Jaclyn Lowe is currently in the action stage of change. As such, her goal is to continue with weight loss efforts She has agreed to keep a food journal with 1100 to 11300 calories and 90 grams of protein daily Jaclyn Lowe has been instructed to work up to a goal of 150 minutes of combined cardio and strengthening exercise per week for weight loss and overall health benefits. We discussed the following Behavioral Modification Strategies today: increasing lean protein intake  Jaclyn Lowe has agreed to follow up with our clinic in 4 weeks. She was informed of the importance of frequent follow up visits to maximize her success with intensive lifestyle modifications for her multiple health conditions.  I, Jaclyn Lowe, am acting as transcriptionist for Jaclyn Duverney, PA-C  I have reviewed the above documentation for accuracy and completeness, and I agree with the above. -Jaclyn Duverney, PA-C  I have reviewed the above note and agree with the plan. -Jaclyn Nip, MD  OBESITY BEHAVIORAL INTERVENTION VISIT  Today's visit was # 13 out of 22.  Starting weight: 222 lbs Starting date: 04/21/16 Today's weight : 178 lbs Today's date: 01/03/2017 Total lbs lost to date: 44 lbs (Patients must lose 7 lbs in the first 6 months to continue with counseling)   ASK: We discussed the diagnosis of obesity with [name] today and Stanton Kidney agreed to give [name] permission to discuss obesity behavioral modification therapy today.  ASSESS: Chany has the diagnosis of obesity and  her BMI today is 31.54 Oliveah is in the action stage of change   ADVISE: Raynell was educated on the multiple health risks of obesity as well as the benefit of weight loss to improve her health. She was advised of the need for long term treatment and the importance of lifestyle modifications.  AGREE: Multiple dietary modification options and treatment options were discussed and  Audry agreed to keep a food journal with 1100 to 1300 calories and 90 grams of protein daily. We discussed the following Behavioral Modification Strategies today: increasing lean protein intake

## 2017-01-04 LAB — COMPREHENSIVE METABOLIC PANEL
ALBUMIN: 4.4 g/dL (ref 3.5–4.8)
ALK PHOS: 68 IU/L (ref 39–117)
ALT: 17 IU/L (ref 0–32)
AST: 19 IU/L (ref 0–40)
Albumin/Globulin Ratio: 1.8 (ref 1.2–2.2)
BILIRUBIN TOTAL: 0.7 mg/dL (ref 0.0–1.2)
BUN / CREAT RATIO: 13 (ref 12–28)
BUN: 13 mg/dL (ref 8–27)
CHLORIDE: 107 mmol/L — AB (ref 96–106)
CO2: 19 mmol/L — ABNORMAL LOW (ref 20–29)
Calcium: 9.5 mg/dL (ref 8.7–10.3)
Creatinine, Ser: 0.99 mg/dL (ref 0.57–1.00)
GFR calc Af Amer: 66 mL/min/{1.73_m2} (ref >59)
GFR calc non Af Amer: 58 mL/min/{1.73_m2} — ABNORMAL LOW (ref >59)
GLOBULIN, TOTAL: 2.4 g/dL (ref 1.5–4.5)
GLUCOSE: 99 mg/dL (ref 65–99)
Potassium: 4.1 mmol/L (ref 3.5–5.2)
SODIUM: 143 mmol/L (ref 134–144)
Total Protein: 6.8 g/dL (ref 6.0–8.5)

## 2017-01-04 LAB — HEMOGLOBIN A1C
ESTIMATED AVERAGE GLUCOSE: 117 mg/dL
HEMOGLOBIN A1C: 5.7 % — AB (ref 4.8–5.6)

## 2017-01-04 LAB — LIPID PANEL WITH LDL/HDL RATIO
Cholesterol, Total: 131 mg/dL (ref 100–199)
HDL: 48 mg/dL (ref >39)
LDL CALC: 71 mg/dL (ref 0–99)
LDL/HDL RATIO: 1.5 ratio (ref 0.0–3.2)
Triglycerides: 61 mg/dL (ref 0–149)
VLDL Cholesterol Cal: 12 mg/dL (ref 5–40)

## 2017-01-04 LAB — INSULIN, RANDOM: INSULIN: 7.2 u[IU]/mL (ref 2.6–24.9)

## 2017-01-04 LAB — VITAMIN D 25 HYDROXY (VIT D DEFICIENCY, FRACTURES): VIT D 25 HYDROXY: 40.1 ng/mL (ref 30.0–100.0)

## 2017-01-06 ENCOUNTER — Encounter (INDEPENDENT_AMBULATORY_CARE_PROVIDER_SITE_OTHER): Payer: Self-pay

## 2017-01-13 DIAGNOSIS — I481 Persistent atrial fibrillation: Secondary | ICD-10-CM | POA: Diagnosis not present

## 2017-01-13 DIAGNOSIS — R9431 Abnormal electrocardiogram [ECG] [EKG]: Secondary | ICD-10-CM | POA: Diagnosis not present

## 2017-01-13 DIAGNOSIS — I4891 Unspecified atrial fibrillation: Secondary | ICD-10-CM | POA: Diagnosis not present

## 2017-01-31 ENCOUNTER — Ambulatory Visit (INDEPENDENT_AMBULATORY_CARE_PROVIDER_SITE_OTHER): Payer: Managed Care, Other (non HMO) | Admitting: Physician Assistant

## 2017-02-14 ENCOUNTER — Ambulatory Visit (INDEPENDENT_AMBULATORY_CARE_PROVIDER_SITE_OTHER): Payer: Managed Care, Other (non HMO) | Admitting: Physician Assistant

## 2017-02-14 VITALS — BP 159/72 | HR 60 | Temp 98.3°F | Ht 63.0 in | Wt 177.0 lb

## 2017-02-14 DIAGNOSIS — E669 Obesity, unspecified: Secondary | ICD-10-CM

## 2017-02-14 DIAGNOSIS — F3289 Other specified depressive episodes: Secondary | ICD-10-CM

## 2017-02-14 DIAGNOSIS — Z6831 Body mass index (BMI) 31.0-31.9, adult: Secondary | ICD-10-CM

## 2017-02-14 DIAGNOSIS — Z9189 Other specified personal risk factors, not elsewhere classified: Secondary | ICD-10-CM

## 2017-02-14 DIAGNOSIS — E559 Vitamin D deficiency, unspecified: Secondary | ICD-10-CM

## 2017-02-14 DIAGNOSIS — I10 Essential (primary) hypertension: Secondary | ICD-10-CM | POA: Diagnosis not present

## 2017-02-14 MED ORDER — TOPIRAMATE 50 MG PO TABS: 50.0000 mg | ORAL_TABLET | Freq: Two times a day (BID) | ORAL | 0 refills | Status: DC

## 2017-02-14 MED ORDER — VITAMIN D (ERGOCALCIFEROL) 1.25 MG (50000 UNIT) PO CAPS: 50000.0000 [IU] | ORAL_CAPSULE | ORAL | 0 refills | Status: DC

## 2017-02-14 MED ORDER — LISINOPRIL 20 MG PO TABS: 20.0000 mg | ORAL_TABLET | Freq: Every day | ORAL | 0 refills | Status: DC

## 2017-02-14 NOTE — Progress Notes (Signed)
Office: [phone]  /  Fax: [phone]   HPI:   Chief Complaint: OBESITY Nabiha is here to discuss her progress with her obesity treatment plan. She is on the keep a food journal with 1100 to 1300 calories and 90 grams of protein daily and is following her eating plan approximately 90 % of the time. She states she is exercising 0 minutes 0 times per week. Lenzie continues to do well with weight loss, despite being on vacation. She managed to be mindful of her eating and she increased controlling her portions. Her weight is 177 lb (80.3 kg) today and has had a weight loss of 1 pound over a period of 6 weeks since her last visit. She has lost 45 lbs since starting treatment with [name].  Vitamin D deficiency Shaterria has a diagnosis of vitamin D deficiency. She is currently taking vit D and denies nausea, vomiting or muscle weakness.  Hypertension Adeleine Pask is a 71 y.o. female with hypertension. Rohini Jaroszewski denies chest pain or shortness of breath on exertion. She is working weight loss to help control her blood pressure with the goal of decreasing her risk of heart attack and stroke. [name] blood pressure is not currently stable and she declines any adjustment to her medications today.  At risk for cardiovascular disease Yurianna is at a higher than average risk for cardiovascular disease due to obesity and hypertension. She currently denies any chest pain.  Depression with emotional eating behaviors Indy is struggling with emotional eating and using food for comfort to the extent that it is negatively impacting her health. She often snacks when she is not hungry. Raksha sometimes feels she is out of control and then feels guilty that she made poor food choices. She has been working on behavior modification techniques to help reduce her emotional eating and has been somewhat successful. Her mood is stable and she shows no sign of suicidal or homicidal ideations.  Depression screen [facility] 2/9 07/14/2016  04/21/2016  Decreased Interest 0 2  Down, Depressed, Hopeless 0 2  PHQ - 2 Score 0 4  Altered sleeping - 1  Tired, decreased energy - 3  Change in appetite - 3  Feeling bad or failure about yourself  - 2  Trouble concentrating - 2  Moving slowly or fidgety/restless - 2  Suicidal thoughts - 0  PHQ-9 Score - 17     ALLERGIES: Allergies  Allergen Reactions  . Neosporin [Neomycin-Bacitracin Zn-Polymyx] Itching and Rash    MEDICATIONS: Current Outpatient Medications on File Prior to Visit  Medication Sig Dispense Refill  . apixaban (ELIQUIS) 5 MG TABS tablet Take 5 mg by mouth 2 (two) times daily.    Marland Kitchen atorvastatin (LIPITOR) 10 MG tablet Take 10 mg by mouth daily.    Marland Kitchen lisinopril (PRINIVIL,ZESTRIL) 20 MG tablet Take 1 tablet (20 mg total) daily by mouth. 30 tablet 0  . topiramate (TOPAMAX) 50 MG tablet Take 1 tablet (50 mg total) 2 (two) times daily by mouth. 60 tablet 0  . triamcinolone cream (KENALOG) 0.5 % Apply 1 application topically 3 (three) times daily. 60 g 6  . Vitamin D, Ergocalciferol, (DRISDOL) 50000 units CAPS capsule Take 1 capsule (50,000 Units total) every 7 (seven) days by mouth. 4 capsule 0   No current facility-administered medications on file prior to visit.     PAST MEDICAL HISTORY: Past Medical History:  Diagnosis Date  . A-fib ([place])   . Back pain   . Bell's palsy 2010  .  Cancer ([place])    basal cell on the nose  . Edema    feet and legs  . Gallbladder disease   . HTN (hypertension)   . Hyperlipidemia   . Joint pain   . Loss of smell   . Loss of taste   . Palpitations   . Prediabetes   . Rectoceles   . SOB (shortness of breath)   . Swallowing difficulty   . Varicose vein of leg     PAST SURGICAL HISTORY: Past Surgical History:  Procedure Laterality Date  . ANKLE SURGERY     left ankle  . AV FISTULA PLACEMENT     leg  . CHOLECYSTECTOMY    . GASTRIC BYPASS    . tummy tuck    . VEIN REPAIR      SOCIAL HISTORY: Social History    Tobacco Use  . Smoking status: Former Research scientist (life sciences)  . Smokeless tobacco: Never Used  . Tobacco comment: Only in college  Substance Use Topics  . Alcohol use: Yes    Comment: Occasional  . Drug use: No    FAMILY HISTORY: Family History  Problem Relation Age of Onset  . Diabetes Mother   . Hyperlipidemia Mother   . Sudden death Mother   . Obesity Mother   . Hypertension Father   . Hyperlipidemia Father   . Heart disease Father   . Sudden death Father     ROS: Review of Systems  Constitutional: Positive for weight loss.  Respiratory: Negative for shortness of breath (on exertion).   Cardiovascular: Negative for chest pain.  Gastrointestinal: Negative for nausea and vomiting.  Musculoskeletal:       Negative muscle weakness  Psychiatric/Behavioral: Positive for depression. Negative for suicidal ideas.    PHYSICAL EXAM: Blood pressure (!) 159/72, pulse 60, temperature 98.3 F (36.8 C), temperature source Oral, height 5\' 3"  (1.6 m), weight 177 lb (80.3 kg), SpO2 96 %. Body mass index is 31.35 kg/m. Physical Exam  Constitutional: She is oriented to person, place, and time. She appears well-developed and well-nourished.  Cardiovascular: Normal rate.  Pulmonary/Chest: Effort normal.  Musculoskeletal: Normal range of motion.  Neurological: She is oriented to person, place, and time.  Skin: Skin is warm and dry.  Psychiatric: She has a normal mood and affect. Her behavior is normal.  Vitals reviewed.   RECENT LABS AND TESTS: BMET    Component Value Date/Time   NA 143 01/03/2017 1059   K 4.1 01/03/2017 1059   CL 107 (H) 01/03/2017 1059   CO2 19 (L) 01/03/2017 1059   GLUCOSE 99 01/03/2017 1059   BUN 13 01/03/2017 1059   CREATININE 0.99 01/03/2017 1059   CALCIUM 9.5 01/03/2017 1059   GFRNONAA 58 (L) 01/03/2017 1059   GFRAA 66 01/03/2017 1059   Lab Results  Component Value Date   HGBA1C 5.7 (H) 01/03/2017   HGBA1C 5.5 08/19/2016   HGBA1C 5.8 (H) 04/21/2016   Lab  Results  Component Value Date   INSULIN 7.2 01/03/2017   INSULIN 8.0 08/19/2016   INSULIN 8.3 04/21/2016   CBC    Component Value Date/Time   WBC 5.4 04/21/2016 1157   RBC 3.87 04/21/2016 1157   HGB 11.4 04/21/2016 1157   HCT 34.7 04/21/2016 1157   PLT 227 04/21/2016 1157   MCV 90 04/21/2016 1157   MCH 29.5 04/21/2016 1157   MCHC 32.9 04/21/2016 1157   RDW 14.2 04/21/2016 1157   LYMPHSABS 0.8 04/21/2016 1157   EOSABS 0.0 04/21/2016  1157   BASOSABS 0.0 04/21/2016 1157   Iron/TIBC/Ferritin/ %Sat No results found for: IRON, TIBC, FERRITIN, IRONPCTSAT Lipid Panel     Component Value Date/Time   CHOL 131 01/03/2017 1059   TRIG 61 01/03/2017 1059   HDL 48 01/03/2017 1059   LDLCALC 71 01/03/2017 1059   Hepatic Function Panel     Component Value Date/Time   PROT 6.8 01/03/2017 1059   ALBUMIN 4.4 01/03/2017 1059   AST 19 01/03/2017 1059   ALT 17 01/03/2017 1059   ALKPHOS 68 01/03/2017 1059   BILITOT 0.7 01/03/2017 1059      Component Value Date/Time   TSH 1.410 04/21/2016 1157    ASSESSMENT AND PLAN: Essential hypertension - Plan: lisinopril (PRINIVIL,ZESTRIL) 20 MG tablet  Vitamin D deficiency - Plan: Vitamin D, Ergocalciferol, (DRISDOL) 50000 units CAPS capsule  Other depression - with emotional eating - Plan: topiramate (TOPAMAX) 50 MG tablet  At risk for heart disease  Class 1 obesity with serious comorbidity and body mass index (BMI) of 31.0 to 31.9 in adult, unspecified obesity type  PLAN:  Vitamin D Deficiency Towana was informed that low vitamin D levels contributes to fatigue and are associated with obesity, breast, and colon cancer. She agrees to continue to take prescription Vit D @50 ,000 IU every week #4 with no refills and will follow up for routine testing of vitamin D, at least 2-3 times per year. She was informed of the risk of over-replacement of vitamin D and agrees to not increase her dose unless he discusses this with [name] first. Tyna agrees to  follow up with our clinic in 4 weeks.  Hypertension We discussed sodium restriction, working on healthy weight loss, and a regular exercise program as the means to achieve improved blood pressure control. Charlii agreed with this plan and agreed to follow up as directed. We will continue to monitor her blood pressure as well as her progress with the above lifestyle modifications. She agrees to continue  Lisinopril 20 mg #30 with no refills and will watch for signs of hypotension as she continues her lifestyle modifications.  Cardiovascular risk counseling Katerina was given extended (15 minutes) coronary artery disease prevention counseling today. She is 71 y.o. female and has risk factors for heart disease including obesity and hypertension. We discussed intensive lifestyle modifications today with an emphasis on specific weight loss instructions and strategies. Pt was also informed of the importance of increasing exercise and decreasing saturated fats to help prevent heart disease.  Depression with Emotional Eating Behaviors We discussed behavior modification techniques today to help Labrina deal with her emotional eating and depression. She has agreed to continue Topamax 50 mg bid #60 with no refills and  follow up as directed.  Obesity Soleia is currently in the action stage of change. As such, her goal is to continue with weight loss efforts She has agreed to keep a food journal with 1100 to 1300 calories and 90 grams of protein daily Kelseigh has been instructed to work up to a goal of 150 minutes of combined cardio and strengthening exercise per week for weight loss and overall health benefits. We discussed the following Behavioral Modification Strategies today: increasing lean protein intake and keeping healthy foods in the home  Zephaniah has agreed to follow up with our clinic in 4 weeks. She was informed of the importance of frequent follow up visits to maximize her success with intensive lifestyle  modifications for her multiple health conditions.  Corey Skains, am acting  as transcriptionist for Lacy Duverney, PA-C  I have reviewed the above documentation for accuracy and completeness, and I agree with the above. -Lacy Duverney, PA-C  I have reviewed the above note and agree with the plan. -Dennard Nip, MD   OBESITY BEHAVIORAL INTERVENTION VISIT  Today's visit was # 14 out of 22.  Starting weight: 222 lbs Starting date: 04/21/16 Today's weight : 177 lbs  Today's date: 02/14/2017 Total lbs lost to date: 13 (Patients must lose 7 lbs in the first 6 months to continue with counseling)   ASK: We discussed the diagnosis of obesity with [name] today and Stanton Kidney agreed to give [name] permission to discuss obesity behavioral modification therapy today.  ASSESS: Natavia has the diagnosis of obesity and her BMI today is 31.36 Syrah is in the action stage of change   ADVISE: Amnah was educated on the multiple health risks of obesity as well as the benefit of weight loss to improve her health. She was advised of the need for long term treatment and the importance of lifestyle modifications.  AGREE: Multiple dietary modification options and treatment options were discussed and  Shyla agreed to keep a food journal with 1100 to 1300 calories and 90 grams of protein daily We discussed the following Behavioral Modification Strategies today: increasing lean protein intake and keeping healthy foods in the home

## 2017-03-09 ENCOUNTER — Ambulatory Visit (INDEPENDENT_AMBULATORY_CARE_PROVIDER_SITE_OTHER): Payer: Managed Care, Other (non HMO) | Admitting: Physician Assistant

## 2017-03-23 ENCOUNTER — Ambulatory Visit (INDEPENDENT_AMBULATORY_CARE_PROVIDER_SITE_OTHER): Payer: Managed Care, Other (non HMO) | Admitting: Physician Assistant

## 2017-03-23 VITALS — BP 166/68 | HR 68 | Temp 97.6°F | Ht 63.0 in | Wt 182.0 lb

## 2017-03-23 DIAGNOSIS — E66811 Obesity, class 1: Secondary | ICD-10-CM

## 2017-03-23 DIAGNOSIS — I10 Essential (primary) hypertension: Secondary | ICD-10-CM

## 2017-03-23 DIAGNOSIS — E559 Vitamin D deficiency, unspecified: Secondary | ICD-10-CM

## 2017-03-23 DIAGNOSIS — Z6832 Body mass index (BMI) 32.0-32.9, adult: Secondary | ICD-10-CM | POA: Diagnosis not present

## 2017-03-23 DIAGNOSIS — Z9189 Other specified personal risk factors, not elsewhere classified: Secondary | ICD-10-CM

## 2017-03-23 DIAGNOSIS — E669 Obesity, unspecified: Secondary | ICD-10-CM | POA: Diagnosis not present

## 2017-03-23 MED ORDER — LISINOPRIL 20 MG PO TABS: 20.0000 mg | ORAL_TABLET | Freq: Every day | ORAL | 0 refills | Status: DC

## 2017-03-23 MED ORDER — VITAMIN D (ERGOCALCIFEROL) 1.25 MG (50000 UNIT) PO CAPS: 50000.0000 [IU] | ORAL_CAPSULE | ORAL | 0 refills | Status: DC

## 2017-03-23 NOTE — Progress Notes (Signed)
Office: [phone]  /  Fax: [phone]   HPI:   Chief Complaint: OBESITY Jaclyn Lowe is here to discuss her progress with her obesity treatment plan. She is on the keep a food journal with 1100 to 1300 calories and 90 grams of protein daily and is following her eating plan approximately 20 % of the time. She states she is exercising 0 minutes 0 times per week. Jaclyn Lowe was on a cruise and found it hard to be mindful of her eating. After getting back, she has gone back to preparing ahead her meals. She is motivated to get back on track and continue with weight loss. Her weight is 182 lb (82.6 kg) today and has had a weight gain of 5 pounds over a period of 5 weeks since her last visit. She has lost 40 lbs since starting treatment with [name].  Hypertension Jaclyn Lowe is a 72 y.o. female with hypertension. Her blood pressure is elevated today and she is not taking her diuretic, she states she gets leg cramps. Jaclyn Lowe declines any change to her blood pressure medications today. Jaclyn Lowe denies chest pain or shortness of breath on exertion. She is working weight loss to help control her blood pressure with the goal of decreasing her risk of heart attack and stroke. Jaclyn Lowe blood pressure is not currently controlled.  At risk for cardiovascular disease Jaclyn Lowe is at a higher than average risk for cardiovascular disease due to obesity and hypertension. She currently denies any chest pain.  Vitamin D deficiency Jaclyn Lowe has a diagnosis of vitamin D deficiency. She is currently taking vit D and denies nausea, vomiting or muscle weakness.   Ref. Range 01/03/2017 10:59  Vitamin D, 25-Hydroxy Latest Ref Range: 30.0 - 100.0 ng/mL 40.1   ALLERGIES: Allergies  Allergen Reactions  . Neosporin [Neomycin-Bacitracin Zn-Polymyx] Itching and Rash    MEDICATIONS: Current Outpatient Medications on File Prior to Visit  Medication Sig Dispense Refill  . apixaban (ELIQUIS) 5 MG TABS tablet Take 5 mg by mouth 2 (two) times  daily.    Marland Kitchen atorvastatin (LIPITOR) 10 MG tablet Take 10 mg by mouth daily.    Marland Kitchen topiramate (TOPAMAX) 50 MG tablet Take 1 tablet (50 mg total) by mouth 2 (two) times daily. 60 tablet 0  . triamcinolone cream (KENALOG) 0.5 % Apply 1 application topically 3 (three) times daily. 60 g 6   No current facility-administered medications on file prior to visit.     PAST MEDICAL HISTORY: Past Medical History:  Diagnosis Date  . A-fib ([place])   . Back pain   . Bell's palsy 2010  . Cancer ([place])    basal cell on the nose  . Edema    feet and legs  . Gallbladder disease   . HTN (hypertension)   . Hyperlipidemia   . Joint pain   . Loss of smell   . Loss of taste   . Palpitations   . Prediabetes   . Rectoceles   . SOB (shortness of breath)   . Swallowing difficulty   . Varicose vein of leg     PAST SURGICAL HISTORY: Past Surgical History:  Procedure Laterality Date  . ANKLE SURGERY     left ankle  . AV FISTULA PLACEMENT     leg  . CHOLECYSTECTOMY    . GASTRIC BYPASS    . tummy tuck    . VEIN REPAIR      SOCIAL HISTORY: Social History   Tobacco Use  . Smoking status: Former  Smoker  . Smokeless tobacco: Never Used  . Tobacco comment: Only in college  Substance Use Topics  . Alcohol use: Yes    Comment: Occasional  . Drug use: No    FAMILY HISTORY: Family History  Problem Relation Age of Onset  . Diabetes Mother   . Hyperlipidemia Mother   . Sudden death Mother   . Obesity Mother   . Hypertension Father   . Hyperlipidemia Father   . Heart disease Father   . Sudden death Father     ROS: Review of Systems  Constitutional: Negative for weight loss.  Respiratory: Negative for shortness of breath (on exertion).   Cardiovascular: Negative for chest pain.       Leg Cramps  Gastrointestinal: Negative for nausea and vomiting.  Musculoskeletal:       Negative muscle weakness    PHYSICAL EXAM: Blood pressure (!) 166/68, pulse 68, temperature 97.6 F (36.4 C),  temperature source Oral, height 5\' 3"  (1.6 m), weight 182 lb (82.6 kg), SpO2 98 %. Body mass index is 32.24 kg/m. Physical Exam  Constitutional: She is oriented to person, place, and time. She appears well-developed and well-nourished.  Cardiovascular: Normal rate.  Pulmonary/Chest: Effort normal.  Musculoskeletal: Normal range of motion.  Neurological: She is oriented to person, place, and time.  Skin: Skin is warm and dry.  Psychiatric: She has a normal mood and affect. Her behavior is normal.  Vitals reviewed.   RECENT LABS AND TESTS: BMET    Component Value Date/Time   NA 143 01/03/2017 1059   K 4.1 01/03/2017 1059   CL 107 (H) 01/03/2017 1059   CO2 19 (L) 01/03/2017 1059   GLUCOSE 99 01/03/2017 1059   BUN 13 01/03/2017 1059   CREATININE 0.99 01/03/2017 1059   CALCIUM 9.5 01/03/2017 1059   GFRNONAA 58 (L) 01/03/2017 1059   GFRAA 66 01/03/2017 1059   Lab Results  Component Value Date   HGBA1C 5.7 (H) 01/03/2017   HGBA1C 5.5 08/19/2016   HGBA1C 5.8 (H) 04/21/2016   Lab Results  Component Value Date   INSULIN 7.2 01/03/2017   INSULIN 8.0 08/19/2016   INSULIN 8.3 04/21/2016   CBC    Component Value Date/Time   WBC 5.4 04/21/2016 1157   RBC 3.87 04/21/2016 1157   HGB 11.4 04/21/2016 1157   HCT 34.7 04/21/2016 1157   PLT 227 04/21/2016 1157   MCV 90 04/21/2016 1157   MCH 29.5 04/21/2016 1157   MCHC 32.9 04/21/2016 1157   RDW 14.2 04/21/2016 1157   LYMPHSABS 0.8 04/21/2016 1157   EOSABS 0.0 04/21/2016 1157   BASOSABS 0.0 04/21/2016 1157   Iron/TIBC/Ferritin/ %Sat No results found for: IRON, TIBC, FERRITIN, IRONPCTSAT Lipid Panel     Component Value Date/Time   CHOL 131 01/03/2017 1059   TRIG 61 01/03/2017 1059   HDL 48 01/03/2017 1059   LDLCALC 71 01/03/2017 1059   Hepatic Function Panel     Component Value Date/Time   PROT 6.8 01/03/2017 1059   ALBUMIN 4.4 01/03/2017 1059   AST 19 01/03/2017 1059   ALT 17 01/03/2017 1059   ALKPHOS 68 01/03/2017  1059   BILITOT 0.7 01/03/2017 1059      Component Value Date/Time   TSH 1.410 04/21/2016 1157     Ref. Range 01/03/2017 10:59  Vitamin D, 25-Hydroxy Latest Ref Range: 30.0 - 100.0 ng/mL 40.1   ASSESSMENT AND PLAN: Essential hypertension - Plan: lisinopril (PRINIVIL,ZESTRIL) 20 MG tablet  Vitamin D deficiency - Plan:  Vitamin D, Ergocalciferol, (DRISDOL) 50000 units CAPS capsule  At risk for heart disease  Class 1 obesity with serious comorbidity and body mass index (BMI) of 32.0 to 32.9 in adult, unspecified obesity type  PLAN:  Hypertension We discussed sodium restriction, working on healthy weight loss, and a regular exercise program as the means to achieve improved blood pressure control. Jaclyn Lowe agreed with this plan and agreed to follow up as directed. We will continue to monitor her blood pressure as well as her progress with the above lifestyle modifications. She agrees to continue Lisinopril 20 mg qd #30 with no refills and will watch for signs of hypotension as she continues her lifestyle modifications.  Cardiovascular risk counseling Jaclyn Lowe was given extended (15 minutes) coronary artery disease prevention counseling today. She is 72 y.o. female and has risk factors for heart disease including obesity and hypertension. We discussed intensive lifestyle modifications today with an emphasis on specific weight loss instructions and strategies. Pt was also informed of the importance of increasing exercise and decreasing saturated fats to help prevent heart disease.  Vitamin D Deficiency Jaclyn Lowe was informed that low vitamin D levels contributes to fatigue and are associated with obesity, breast, and colon cancer. She agrees to continue to take prescription Vit D @50 ,000 IU every week #4 with no refills and will follow up for routine testing of vitamin D, at least 2-3 times per year. She was informed of the risk of over-replacement of vitamin D and agrees to not increase her dose unless she  discusses this with [name] first.  Obesity Jaclyn Lowe is currently in the action stage of change. As such, her goal is to continue with weight loss efforts She has agreed to follow the Category 2 plan Jaclyn Lowe has been instructed to work up to a goal of 150 minutes of combined cardio and strengthening exercise per week for weight loss and overall health benefits. We discussed the following Behavioral Modification Strategies today: increasing lean protein intake and work on meal planning and easy cooking plans  Jaclyn Lowe has agreed to follow up with our clinic in 4 weeks. She was informed of the importance of frequent follow up visits to maximize her success with intensive lifestyle modifications for her multiple health conditions.  OBESITY BEHAVIORAL INTERVENTION VISIT  Today's visit was # 15 out of 22.  Starting weight: 222 lbs Starting date: 04/21/16 Today's weight : 182 lbs Today's date: 03/23/2017 Total lbs lost to date: 48 (Patients must lose 7 lbs in the first 6 months to continue with counseling)   ASK: We discussed the diagnosis of obesity with Jaclyn Lowe today and Jaclyn Lowe agreed to give [name] permission to discuss obesity behavioral modification therapy today.  ASSESS: Jaclyn Lowe has the diagnosis of obesity and her BMI today is 32.25 Jaclyn Lowe is in the action stage of change   ADVISE: Jaclyn Lowe was educated on the multiple health risks of obesity as well as the benefit of weight loss to improve her health. She was advised of the need for long term treatment and the importance of lifestyle modifications.  AGREE: Multiple dietary modification options and treatment options were discussed and  Jaclyn Lowe agreed to the above obesity treatment plan.   Corey Skains, am acting as transcriptionist for [employer], PA-C I, Lacy Duverney [facility], have reviewed this note and agree with its contents.

## 2017-04-20 ENCOUNTER — Ambulatory Visit (INDEPENDENT_AMBULATORY_CARE_PROVIDER_SITE_OTHER): Payer: Managed Care, Other (non HMO) | Admitting: Physician Assistant

## 2017-04-20 ENCOUNTER — Encounter (INDEPENDENT_AMBULATORY_CARE_PROVIDER_SITE_OTHER): Payer: Self-pay

## 2017-04-27 ENCOUNTER — Ambulatory Visit (INDEPENDENT_AMBULATORY_CARE_PROVIDER_SITE_OTHER): Payer: PRIVATE HEALTH INSURANCE | Admitting: Physician Assistant

## 2017-04-27 VITALS — BP 165/67 | HR 60 | Temp 98.7°F | Ht 63.0 in | Wt 180.0 lb

## 2017-04-27 DIAGNOSIS — E559 Vitamin D deficiency, unspecified: Secondary | ICD-10-CM

## 2017-04-27 DIAGNOSIS — I10 Essential (primary) hypertension: Secondary | ICD-10-CM

## 2017-04-27 DIAGNOSIS — Z6831 Body mass index (BMI) 31.0-31.9, adult: Secondary | ICD-10-CM

## 2017-04-27 DIAGNOSIS — E669 Obesity, unspecified: Secondary | ICD-10-CM

## 2017-04-27 MED ORDER — LISINOPRIL 20 MG PO TABS: 20.0000 mg | ORAL_TABLET | Freq: Every day | ORAL | 0 refills | Status: DC

## 2017-04-27 MED ORDER — VITAMIN D (ERGOCALCIFEROL) 1.25 MG (50000 UNIT) PO CAPS: 50000.0000 [IU] | ORAL_CAPSULE | ORAL | 0 refills | Status: DC

## 2017-04-27 NOTE — Progress Notes (Signed)
Office: [phone]  /  Fax: [phone]   HPI:   Chief Complaint: OBESITY Jaclyn Lowe is here to discuss her progress with her obesity treatment plan. She is on the Category 2 plan and is following her eating plan approximately 80 % of the time. She states she is walking for 30 minutes 30 times per week. Jaclyn Lowe continues to do well with weight loss. She will be retiring in 2 weeks and has had increase in emotional eating. She requests a longer follow up time.  Her weight is 180 lb (81.6 kg) today and has had a weight loss of 2 pounds over a period of 5 weeks since her last visit. She has lost 42 lbs since starting treatment with [name].  Vitamin D Deficiency Jaclyn Lowe has a diagnosis of vitamin D deficiency. She is currently taking prescription Vit D and denies nausea, vomiting or muscle weakness.  Hypertension Jaclyn Lowe is a 72 y.o. female with hypertension. Jaclyn Lowe's blood pressure is elevated. She states blood pressure at home in the morning was 136/70. She denies chest pain or shortness of breath. She is working weight loss to help control her blood pressure with the goal of decreasing her risk of heart attack and stroke. Jaclyn Lowe's blood pressure is not currently controlled.  ALLERGIES: Allergies  Allergen Reactions  . Neosporin [Neomycin-Bacitracin Zn-Polymyx] Itching and Rash    MEDICATIONS: Current Outpatient Medications on File Prior to Visit  Medication Sig Dispense Refill  . apixaban (ELIQUIS) 5 MG TABS tablet Take 5 mg by mouth 2 (two) times daily.    Marland Kitchen atorvastatin (LIPITOR) 10 MG tablet Take 10 mg by mouth daily.    Marland Kitchen lisinopril (PRINIVIL,ZESTRIL) 20 MG tablet Take 1 tablet (20 mg total) by mouth daily. 30 tablet 0  . topiramate (TOPAMAX) 50 MG tablet Take 1 tablet (50 mg total) by mouth 2 (two) times daily. 60 tablet 0  . triamcinolone cream (KENALOG) 0.5 % Apply 1 application topically 3 (three) times daily. 60 g 6  . Vitamin D, Ergocalciferol, (DRISDOL) 50000 units CAPS capsule Take 1  capsule (50,000 Units total) by mouth every 7 (seven) days. 4 capsule 0   No current facility-administered medications on file prior to visit.     PAST MEDICAL HISTORY: Past Medical History:  Diagnosis Date  . A-fib ([place])   . Back pain   . Bell's palsy 2010  . Cancer ([place])    basal cell on the nose  . Edema    feet and legs  . Gallbladder disease   . HTN (hypertension)   . Hyperlipidemia   . Joint pain   . Loss of smell   . Loss of taste   . Palpitations   . Prediabetes   . Rectoceles   . SOB (shortness of breath)   . Swallowing difficulty   . Varicose vein of leg     PAST SURGICAL HISTORY: Past Surgical History:  Procedure Laterality Date  . ANKLE SURGERY     left ankle  . AV FISTULA PLACEMENT     leg  . CHOLECYSTECTOMY    . GASTRIC BYPASS    . tummy tuck    . VEIN REPAIR      SOCIAL HISTORY: Social History   Tobacco Use  . Smoking status: Former Research scientist (life sciences)  . Smokeless tobacco: Never Used  . Tobacco comment: Only in college  Substance Use Topics  . Alcohol use: Yes    Comment: Occasional  . Drug use: No    FAMILY HISTORY: Family  History  Problem Relation Age of Onset  . Diabetes Mother   . Hyperlipidemia Mother   . Sudden death Mother   . Obesity Mother   . Hypertension Father   . Hyperlipidemia Father   . Heart disease Father   . Sudden death Father     ROS: Review of Systems  Constitutional: Positive for weight loss.  Respiratory: Negative for shortness of breath.   Cardiovascular: Negative for chest pain.  Gastrointestinal: Negative for nausea and vomiting.  Musculoskeletal:       Negative muscle weakness    PHYSICAL EXAM: Blood pressure (!) 165/67, pulse 60, temperature 98.7 F (37.1 C), temperature source Oral, height 5\' 3"  (1.6 m), weight 180 lb (81.6 kg), SpO2 97 %. Body mass index is 31.89 kg/m. Physical Exam  Constitutional: She is oriented to person, place, and time. She appears well-developed and well-nourished.    Cardiovascular: Normal rate.  Pulmonary/Chest: Effort normal.  Musculoskeletal: Normal range of motion.  Neurological: She is oriented to person, place, and time.  Skin: Skin is warm and dry.  Psychiatric: She has a normal mood and affect. Her behavior is normal.  Vitals reviewed.   RECENT LABS AND TESTS: BMET    Component Value Date/Time   NA 143 01/03/2017 1059   K 4.1 01/03/2017 1059   CL 107 (H) 01/03/2017 1059   CO2 19 (L) 01/03/2017 1059   GLUCOSE 99 01/03/2017 1059   BUN 13 01/03/2017 1059   CREATININE 0.99 01/03/2017 1059   CALCIUM 9.5 01/03/2017 1059   GFRNONAA 58 (L) 01/03/2017 1059   GFRAA 66 01/03/2017 1059   Lab Results  Component Value Date   HGBA1C 5.7 (H) 01/03/2017   HGBA1C 5.5 08/19/2016   HGBA1C 5.8 (H) 04/21/2016   Lab Results  Component Value Date   INSULIN 7.2 01/03/2017   INSULIN 8.0 08/19/2016   INSULIN 8.3 04/21/2016   CBC    Component Value Date/Time   WBC 5.4 04/21/2016 1157   RBC 3.87 04/21/2016 1157   HGB 11.4 04/21/2016 1157   HCT 34.7 04/21/2016 1157   PLT 227 04/21/2016 1157   MCV 90 04/21/2016 1157   MCH 29.5 04/21/2016 1157   MCHC 32.9 04/21/2016 1157   RDW 14.2 04/21/2016 1157   LYMPHSABS 0.8 04/21/2016 1157   EOSABS 0.0 04/21/2016 1157   BASOSABS 0.0 04/21/2016 1157   Iron/TIBC/Ferritin/ %Sat No results found for: IRON, TIBC, FERRITIN, IRONPCTSAT Lipid Panel     Component Value Date/Time   CHOL 131 01/03/2017 1059   TRIG 61 01/03/2017 1059   HDL 48 01/03/2017 1059   LDLCALC 71 01/03/2017 1059   Hepatic Function Panel     Component Value Date/Time   PROT 6.8 01/03/2017 1059   ALBUMIN 4.4 01/03/2017 1059   AST 19 01/03/2017 1059   ALT 17 01/03/2017 1059   ALKPHOS 68 01/03/2017 1059   BILITOT 0.7 01/03/2017 1059      Component Value Date/Time   TSH 1.410 04/21/2016 1157  Results for MEI, SUITS (MRN [id number]) as of 04/27/2017 16:25  Ref. Range 01/03/2017 10:59  Vitamin D, 25-Hydroxy Latest Ref Range: 30.0  - 100.0 ng/mL 40.1    ASSESSMENT AND PLAN: Vitamin D deficiency - Plan: Vitamin D, Ergocalciferol, (DRISDOL) 50000 units CAPS capsule  Essential hypertension - Plan: lisinopril (PRINIVIL,ZESTRIL) 20 MG tablet  Class 1 obesity with serious comorbidity and body mass index (BMI) of 31.0 to 31.9 in adult, unspecified obesity type  PLAN:  Vitamin D Deficiency Jaclyn Lowe was informed that  low vitamin D levels contributes to fatigue and are associated with obesity, breast, and colon cancer. Jaclyn Lowe agrees to continue taking prescription Vit D @50 ,000 IU every week #4 and we will refill for 1 month. She will follow up for routine testing of vitamin D, at least 2-3 times per year. She was informed of the risk of over-replacement of vitamin D and agrees to not increase her dose unless she discusses this with [name] first. Jaclyn Lowe agrees to follow up with our clinic in 4 weeks.  Hypertension We discussed sodium restriction, working on healthy weight loss, and a regular exercise program as the means to achieve improved blood pressure control. Jaclyn Lowe agreed with this plan and agreed to follow up as directed. We will continue to monitor her blood pressure as well as her progress with the above lifestyle modifications. Jaclyn Lowe agrees to continue taking lisinopril 20 mg #30 and we will refill for 1 month. She will watch for signs of hypotension as she continues her lifestyle modifications. Jaclyn Lowe agrees to follow up with our clinic in 4 weeks.  Obesity Jaclyn Lowe is currently in the action stage of change. As such, her goal is to continue with weight loss efforts She has agreed to follow the Category 2 plan Jaclyn Lowe has been instructed to work up to a goal of 150 minutes of combined cardio and strengthening exercise per week for weight loss and overall health benefits. We discussed the following Behavioral Modification Strategies today: increasing lean protein intake and planning for success   Jaclyn Lowe has agreed to follow up with our clinic  in 4 weeks. She was informed of the importance of frequent follow up visits to maximize her success with intensive lifestyle modifications for her multiple health conditions.   OBESITY BEHAVIORAL INTERVENTION VISIT  Today's visit was # 16 out of 22.  Starting weight: 222 lbs Starting date: 04/21/16 Today's weight : 180 lbs Today's date: 04/27/2017 Total lbs lost to date: 29 (Patients must lose 7 lbs in the first 6 months to continue with counseling)   ASK: We discussed the diagnosis of obesity with Jaclyn Lowe today and Jaclyn Lowe agreed to give [name] permission to discuss obesity behavioral modification therapy today.  ASSESS: Jaclyn Lowe has the diagnosis of obesity and her BMI today is 31.89 Jaclyn Lowe is in the action stage of change   ADVISE: Jaclyn Lowe was educated on the multiple health risks of obesity as well as the benefit of weight loss to improve her health. She was advised of the need for long term treatment and the importance of lifestyle modifications.  AGREE: Multiple dietary modification options and treatment options were discussed and  Jaclyn Lowe agreed to the above obesity treatment plan.   Jaclyn Lowe, am acting as transcriptionist for Lacy Duverney, PA-C I, Lacy Duverney [facility], have reviewed this note and agree with its content

## 2017-05-25 ENCOUNTER — Ambulatory Visit (INDEPENDENT_AMBULATORY_CARE_PROVIDER_SITE_OTHER): Payer: Medicare Other | Admitting: Physician Assistant

## 2017-05-25 ENCOUNTER — Ambulatory Visit (INDEPENDENT_AMBULATORY_CARE_PROVIDER_SITE_OTHER): Payer: Medicare Other | Admitting: *Deleted

## 2017-05-25 VITALS — BP 165/79 | HR 63 | Temp 98.4°F | Ht 63.0 in | Wt 181.0 lb

## 2017-05-25 DIAGNOSIS — E669 Obesity, unspecified: Secondary | ICD-10-CM

## 2017-05-25 DIAGNOSIS — Z6832 Body mass index (BMI) 32.0-32.9, adult: Secondary | ICD-10-CM | POA: Diagnosis not present

## 2017-05-25 DIAGNOSIS — F3289 Other specified depressive episodes: Secondary | ICD-10-CM

## 2017-05-25 DIAGNOSIS — Z9189 Other specified personal risk factors, not elsewhere classified: Secondary | ICD-10-CM | POA: Diagnosis not present

## 2017-05-25 DIAGNOSIS — I10 Essential (primary) hypertension: Secondary | ICD-10-CM

## 2017-05-25 DIAGNOSIS — E66811 Obesity, class 1: Secondary | ICD-10-CM

## 2017-05-25 DIAGNOSIS — E559 Vitamin D deficiency, unspecified: Secondary | ICD-10-CM | POA: Diagnosis not present

## 2017-05-25 DIAGNOSIS — Z23 Encounter for immunization: Secondary | ICD-10-CM

## 2017-05-25 MED ORDER — LISINOPRIL 20 MG PO TABS: 20.0000 mg | ORAL_TABLET | Freq: Every day | ORAL | 0 refills | Status: DC

## 2017-05-25 MED ORDER — VITAMIN D (ERGOCALCIFEROL) 1.25 MG (50000 UNIT) PO CAPS: 50000.0000 [IU] | ORAL_CAPSULE | ORAL | 0 refills | Status: DC

## 2017-05-25 NOTE — Progress Notes (Signed)
Office: [phone]  /  Fax: [phone]   HPI:   Chief Complaint: OBESITY Jaclyn Lowe is here to discuss her progress with her obesity treatment plan. She is on the Category 2 plan and is following her eating plan approximately 80 % of the time. She states she is walking for 20 minutes 3 times per week. Jaclyn Lowe has had increased emotional eating now that she is retired. She manages to make smarter food choices and she tries to control her portions. She declines any changes to her meal plan. Her weight is 181 lb (82.1 kg) today and has had a weight loss of 1 pounds over a period of 4 weeks since her last visit. She has lost 41 lbs since starting treatment with [name].  Vitamin D deficiency Jaclyn Lowe has a diagnosis of vitamin D deficiency. She is currently taking vit D and denies nausea, vomiting or muscle weakness.  Hypertension Jaclyn Lowe is a 72 y.o. female with hypertension. Her blood pressure is elevated and she states she has not been taking her due to muscle cramps of her legs. She was offered potassium with her diuretic but she declines. Jaclyn Lowe denies chest pain or shortness of breath on exertion. She is working weight loss to help control her blood pressure with the goal of decreasing her risk of heart attack and stroke. Jaclyn Lowe blood pressure is not currently controlled.  Depression with emotional eating behaviors Jaclyn Lowe has stopped taking topamax and sates she has been managing her cravings. Jaclyn Lowe struggles with emotional eating and using food for comfort to the extent that it is negatively impacting her health. She often snacks when she is not hungry. Jaclyn Lowe sometimes feels she is out of control and then feels guilty that she made poor food choices. She has been working on behavior modification techniques to help reduce her emotional eating and has been somewhat successful. Her mood is stable and she shows no sign of suicidal or homicidal ideations.  Depression screen Jaclyn Lowe 2/9 07/14/2016 04/21/2016    Decreased Interest 0 2  Down, Depressed, Hopeless 0 2  PHQ - 2 Score 0 4  Altered sleeping - 1  Tired, decreased energy - 3  Change in appetite - 3  Feeling bad or failure about yourself  - 2  Trouble concentrating - 2  Moving slowly or fidgety/restless - 2  Suicidal thoughts - 0  PHQ-9 Score - 17     ALLERGIES: Allergies  Allergen Reactions  . Neosporin [Neomycin-Bacitracin Zn-Polymyx] Itching and Rash    MEDICATIONS: Current Outpatient Medications on File Prior to Visit  Medication Sig Dispense Refill  . apixaban (ELIQUIS) 5 MG TABS tablet Take 5 mg by mouth 2 (two) times daily.    Marland Kitchen atorvastatin (LIPITOR) 10 MG tablet Take 10 mg by mouth daily.    Marland Kitchen lisinopril (PRINIVIL,ZESTRIL) 20 MG tablet Take 1 tablet (20 mg total) by mouth daily. 30 tablet 0  . triamcinolone cream (KENALOG) 0.5 % Apply 1 application topically 3 (three) times daily. 60 g 6  . Vitamin D, Ergocalciferol, (DRISDOL) 50000 units CAPS capsule Take 1 capsule (50,000 Units total) by mouth every 7 (seven) days. 4 capsule 0   No current facility-administered medications on file prior to visit.     PAST MEDICAL HISTORY: Past Medical History:  Diagnosis Date  . A-fib ([place])   . Back pain   . Bell's palsy 2010  . Cancer ([place])    basal cell on the nose  . Edema    feet and  legs  . Gallbladder disease   . HTN (hypertension)   . Hyperlipidemia   . Joint pain   . Loss of smell   . Loss of taste   . Palpitations   . Prediabetes   . Rectoceles   . SOB (shortness of breath)   . Swallowing difficulty   . Varicose vein of leg     PAST SURGICAL HISTORY: Past Surgical History:  Procedure Laterality Date  . ANKLE SURGERY     left ankle  . AV FISTULA PLACEMENT     leg  . CHOLECYSTECTOMY    . GASTRIC BYPASS    . tummy tuck    . VEIN REPAIR      SOCIAL HISTORY: Social History   Tobacco Use  . Smoking status: Former Research scientist (life sciences)  . Smokeless tobacco: Never Used  . Tobacco comment: Only in college   Substance Use Topics  . Alcohol use: Yes    Comment: Occasional  . Drug use: No    FAMILY HISTORY: Family History  Problem Relation Age of Onset  . Diabetes Mother   . Hyperlipidemia Mother   . Sudden death Mother   . Obesity Mother   . Hypertension Father   . Hyperlipidemia Father   . Heart disease Father   . Sudden death Father     ROS: Review of Systems  Constitutional: Negative for weight loss.  Respiratory: Negative for shortness of breath (on exertion).   Cardiovascular: Negative for chest pain.  Gastrointestinal: Negative for nausea and vomiting.  Musculoskeletal:       Negative for muscle weakness  Psychiatric/Behavioral: Positive for depression. Negative for suicidal ideas.    PHYSICAL EXAM: Blood pressure (!) 165/79, pulse 63, temperature 98.4 F (36.9 C), temperature source Oral, height 5\' 3"  (1.6 m), weight 181 lb (82.1 kg), SpO2 98 %. Body mass index is 32.06 kg/m. Physical Exam  Constitutional: She is oriented to person, place, and time. She appears well-developed and well-nourished.  Cardiovascular: Normal rate.  Pulmonary/Chest: Effort normal.  Musculoskeletal: Normal range of motion.  Neurological: She is oriented to person, place, and time.  Skin: Skin is warm and dry.  Psychiatric: She has a normal mood and affect. Her behavior is normal.  Vitals reviewed.   RECENT LABS AND TESTS: BMET    Component Value Date/Time   NA 143 01/03/2017 1059   K 4.1 01/03/2017 1059   CL 107 (H) 01/03/2017 1059   CO2 19 (L) 01/03/2017 1059   GLUCOSE 99 01/03/2017 1059   BUN 13 01/03/2017 1059   CREATININE 0.99 01/03/2017 1059   CALCIUM 9.5 01/03/2017 1059   GFRNONAA 58 (L) 01/03/2017 1059   GFRAA 66 01/03/2017 1059   Lab Results  Component Value Date   HGBA1C 5.7 (H) 01/03/2017   HGBA1C 5.5 08/19/2016   HGBA1C 5.8 (H) 04/21/2016   Lab Results  Component Value Date   INSULIN 7.2 01/03/2017   INSULIN 8.0 08/19/2016   INSULIN 8.3 04/21/2016    CBC    Component Value Date/Time   WBC 5.4 04/21/2016 1157   RBC 3.87 04/21/2016 1157   HGB 11.4 04/21/2016 1157   HCT 34.7 04/21/2016 1157   PLT 227 04/21/2016 1157   MCV 90 04/21/2016 1157   MCH 29.5 04/21/2016 1157   MCHC 32.9 04/21/2016 1157   RDW 14.2 04/21/2016 1157   LYMPHSABS 0.8 04/21/2016 1157   EOSABS 0.0 04/21/2016 1157   BASOSABS 0.0 04/21/2016 1157   Iron/TIBC/Ferritin/ %Sat No results found for: IRON, TIBC,  FERRITIN, IRONPCTSAT Lipid Panel     Component Value Date/Time   CHOL 131 01/03/2017 1059   TRIG 61 01/03/2017 1059   HDL 48 01/03/2017 1059   LDLCALC 71 01/03/2017 1059   Hepatic Function Panel     Component Value Date/Time   PROT 6.8 01/03/2017 1059   ALBUMIN 4.4 01/03/2017 1059   AST 19 01/03/2017 1059   ALT 17 01/03/2017 1059   ALKPHOS 68 01/03/2017 1059   BILITOT 0.7 01/03/2017 1059      Component Value Date/Time   TSH 1.410 04/21/2016 1157   Results for ASSYRIA, MORREALE (MRN [id number]) as of 05/25/2017 11:12  Ref. Range 01/03/2017 10:59  Vitamin D, 25-Hydroxy Latest Ref Range: 30.0 - 100.0 ng/mL 40.1   ASSESSMENT AND PLAN: Essential hypertension - Plan: lisinopril (PRINIVIL,ZESTRIL) 20 MG tablet  Vitamin D deficiency - Plan: Vitamin D, Ergocalciferol, (DRISDOL) 50000 units CAPS capsule  Other depression - with emotional eating  At risk for heart disease  Class 1 obesity with serious comorbidity and body mass index (BMI) of 32.0 to 32.9 in adult, unspecified obesity type  PLAN:  Vitamin D Deficiency Jaclyn Lowe was informed that low vitamin D levels contributes to fatigue and are associated with obesity, breast, and colon cancer. She agrees to continue to take prescription Vit D @50 ,000 IU every week #4 with no refills and will follow up for routine testing of vitamin D, at least 2-3 times per year. She was informed of the risk of over-replacement of vitamin D and agrees to not increase her dose unless she discusses this with [name] first. Jaclyn Lowe  agrees to follow up with our clinic in 3 weeks.  Hypertension We discussed sodium restriction, working on healthy weight loss, and a regular exercise program as the means to achieve improved blood pressure control. Jaclyn Lowe agreed with this plan and agreed to follow up as directed. We will continue to monitor her blood pressure as well as her progress with the above lifestyle modifications. She agreed to continue lisinopril 20 mg daily #30 with no refills and will watch for signs of hypotension as she continues her lifestyle modifications.  Depression with Emotional Eating Behaviors We discussed behavior modification techniques today to help Jaclyn Lowe deal with her emotional eating and depression. She will discontinue topamax and follow up as directed.  Obesity Jaclyn Lowe is currently in the action stage of change. As such, her goal is to continue with weight loss efforts She has agreed to follow the Category 2 plan Jaclyn Lowe has been instructed to work up to a goal of 150 minutes of combined cardio and strengthening exercise per week for weight loss and overall health benefits. We discussed the following Behavioral Modification Strategies today: increasing lean protein intake and work on meal planning and easy cooking plans  Jaclyn Lowe has agreed to follow up with our clinic in 3 weeks. She was informed of the importance of frequent follow up visits to maximize her success with intensive lifestyle modifications for her multiple health conditions.   OBESITY BEHAVIORAL INTERVENTION VISIT  Today's visit was # 17 out of 22.  Starting weight: 222 lbs Starting date: 04/21/16 Today's weight : 181 lbs Today's date: 05/25/2017 Total lbs lost to date: 3 (Patients must lose 7 lbs in the first 6 months to continue with counseling)   ASK: We discussed the diagnosis of obesity with Jaclyn Lowe today and Jaclyn Lowe agreed to give [name] permission to discuss obesity behavioral modification therapy today.  ASSESS: Jaclyn Lowe has the  diagnosis of obesity and  her BMI today is 32.07 Jaclyn Lowe is in the action stage of change   ADVISE: Jaclyn Lowe was educated on the multiple health risks of obesity as well as the benefit of weight loss to improve her health. She was advised of the need for long term treatment and the importance of lifestyle modifications.  AGREE: Multiple dietary modification options and treatment options were discussed and  Jaclyn Lowe agreed to the above obesity treatment plan.   Corey Skains, am acting as transcriptionist for [employer], PA-C I, Lacy Duverney [facility], have reviewed this note and agree with its content

## 2017-06-20 ENCOUNTER — Ambulatory Visit (INDEPENDENT_AMBULATORY_CARE_PROVIDER_SITE_OTHER): Payer: Medicare Other | Admitting: Family Medicine

## 2017-06-20 ENCOUNTER — Encounter (INDEPENDENT_AMBULATORY_CARE_PROVIDER_SITE_OTHER): Payer: Self-pay

## 2017-06-21 ENCOUNTER — Other Ambulatory Visit: Payer: Self-pay | Admitting: *Deleted

## 2017-06-21 DIAGNOSIS — I10 Essential (primary) hypertension: Secondary | ICD-10-CM

## 2017-06-21 MED ORDER — LISINOPRIL 20 MG PO TABS: 20.0000 mg | ORAL_TABLET | Freq: Every day | ORAL | 0 refills | Status: DC

## 2017-07-01 DIAGNOSIS — Z1231 Encounter for screening mammogram for malignant neoplasm of breast: Secondary | ICD-10-CM | POA: Diagnosis not present

## 2017-07-01 DIAGNOSIS — Z01419 Encounter for gynecological examination (general) (routine) without abnormal findings: Secondary | ICD-10-CM | POA: Diagnosis not present

## 2017-07-12 ENCOUNTER — Ambulatory Visit (INDEPENDENT_AMBULATORY_CARE_PROVIDER_SITE_OTHER): Payer: Medicare Other | Admitting: Physician Assistant

## 2017-07-12 ENCOUNTER — Other Ambulatory Visit: Payer: Self-pay | Admitting: Physician Assistant

## 2017-07-12 ENCOUNTER — Ambulatory Visit (HOSPITAL_COMMUNITY)
Admission: RE | Admit: 2017-07-12 | Discharge: 2017-07-12 | Disposition: A | Discharge status: Home / Self Care | Payer: Medicare Other | Source: Ambulatory Visit | Attending: Physician Assistant | Admitting: Physician Assistant

## 2017-07-12 ENCOUNTER — Ambulatory Visit (INDEPENDENT_AMBULATORY_CARE_PROVIDER_SITE_OTHER): Payer: Medicare Other

## 2017-07-12 ENCOUNTER — Encounter: Payer: Self-pay | Admitting: Physician Assistant

## 2017-07-12 VITALS — BP 183/83 | HR 64 | Temp 97.0°F | Ht 63.0 in | Wt 181.0 lb

## 2017-07-12 DIAGNOSIS — N200 Calculus of kidney: Secondary | ICD-10-CM | POA: Diagnosis not present

## 2017-07-12 DIAGNOSIS — M545 Low back pain: Secondary | ICD-10-CM

## 2017-07-12 DIAGNOSIS — Z1211 Encounter for screening for malignant neoplasm of colon: Secondary | ICD-10-CM

## 2017-07-12 DIAGNOSIS — E559 Vitamin D deficiency, unspecified: Secondary | ICD-10-CM | POA: Diagnosis not present

## 2017-07-12 DIAGNOSIS — M4856XA Collapsed vertebra, not elsewhere classified, lumbar region, initial encounter for fracture: Secondary | ICD-10-CM | POA: Diagnosis not present

## 2017-07-12 DIAGNOSIS — J90 Pleural effusion, not elsewhere classified: Secondary | ICD-10-CM | POA: Diagnosis not present

## 2017-07-12 DIAGNOSIS — R109 Unspecified abdominal pain: Secondary | ICD-10-CM

## 2017-07-12 DIAGNOSIS — D734 Cyst of spleen: Secondary | ICD-10-CM | POA: Diagnosis not present

## 2017-07-12 DIAGNOSIS — I10 Essential (primary) hypertension: Secondary | ICD-10-CM

## 2017-07-12 DIAGNOSIS — I313 Pericardial effusion (noninflammatory): Secondary | ICD-10-CM | POA: Diagnosis not present

## 2017-07-12 DIAGNOSIS — K8020 Calculus of gallbladder without cholecystitis without obstruction: Secondary | ICD-10-CM

## 2017-07-12 DIAGNOSIS — R140 Abdominal distension (gaseous): Secondary | ICD-10-CM | POA: Diagnosis not present

## 2017-07-12 MED ORDER — CYCLOBENZAPRINE HCL 10 MG PO TABS: 10.0000 mg | ORAL_TABLET | Freq: Three times a day (TID) | ORAL | 0 refills | Status: DC | PRN

## 2017-07-12 MED ORDER — LISINOPRIL 20 MG PO TABS: 20.0000 mg | ORAL_TABLET | Freq: Every day | ORAL | 3 refills | Status: DC

## 2017-07-12 MED ORDER — VITAMIN D (ERGOCALCIFEROL) 1.25 MG (50000 UNIT) PO CAPS
50000.0000 [IU] | ORAL_CAPSULE | ORAL | 3 refills | Status: DC
Start: 2017-07-12 — End: 2018-07-07

## 2017-07-12 MED ORDER — TOPIRAMATE 50 MG PO TABS: 50.0000 mg | ORAL_TABLET | Freq: Two times a day (BID) | ORAL | 1 refills | Status: DC

## 2017-07-12 NOTE — Patient Instructions (Signed)
In a few days you may receive a survey in the mail or online from [employer] regarding your visit with us today. Please take a moment to fill this out. Your feedback is very important to our whole office. It can help us better understand your needs as well as improve your experience and satisfaction. Thank you for taking your time to complete it. We care about you.  Izear Pine, PA-C  

## 2017-07-13 LAB — MICROALBUMIN / CREATININE URINE RATIO
CREATININE, UR: 91 mg/dL
MICROALBUM., U, RANDOM: 64.1 ug/mL
Microalb/Creat Ratio: 70.4 mg/g creat — ABNORMAL HIGH (ref 0.0–30.0)

## 2017-07-13 LAB — BMP8+EGFR
BUN/Creatinine Ratio: 14 (ref 12–28)
BUN: 12 mg/dL (ref 8–27)
CHLORIDE: 106 mmol/L (ref 96–106)
CO2: 23 mmol/L (ref 20–29)
Calcium: 8.8 mg/dL (ref 8.7–10.3)
Creatinine, Ser: 0.86 mg/dL (ref 0.57–1.00)
GFR calc Af Amer: 79 mL/min/{1.73_m2} (ref >59)
GFR calc non Af Amer: 68 mL/min/{1.73_m2} (ref >59)
GLUCOSE: 94 mg/dL (ref 65–99)
Potassium: 4.1 mmol/L (ref 3.5–5.2)
Sodium: 142 mmol/L (ref 134–144)

## 2017-07-13 NOTE — Progress Notes (Signed)
BP (!) 183/83   Pulse 64   Temp (!) 97 F (36.1 C) (Oral)   Ht _0  (1.6 m)   Wt 181 lb (82.1 kg)   BMI 32.06 kg/m     Subjective:    Patient ID: Jaclyn Lowe, female    DOB: [birth year], 72 y.o.   MRN: [id number]  HPI: Vaniah Chambers is a 72 y.o. female presenting on 07/12/2017 for No chief complaint on file.  This patient comes in having had a fall and having low back pain ever since that happened.  She had some leftover muscle relaxants and has used those with some relief.  She also had a few pain pills.  She fell from the golf cart and hit her back on cement.  When we performed the x-ray here did show some abnormal foreign bodies in the posterior section of her back.  There was an endplate fracture of L4.  She also has had a gallbladder surgery in the past it was in 2016.  She has no other abdominal pains at this time.  CT was performed and it was found to be spilled gallstones.  But some wall thickening in her rectum.  We will make a gastroenterology referral.   Past Medical History:  Diagnosis Date  . A-fib ([place])   . Back pain   . Bell's palsy 2010  . Cancer ([place])    basal cell on the nose  . Edema    feet and legs  . Gallbladder disease   . HTN (hypertension)   . Hyperlipidemia   . Joint pain   . Loss of smell   . Loss of taste   . Palpitations   . Prediabetes   . Rectoceles   . SOB (shortness of breath)   . Swallowing difficulty   . Varicose vein of leg    Relevant past medical, surgical, family and social history reviewed and updated as indicated. Interim medical history since our last visit reviewed. Allergies and medications reviewed and updated. DATA REVIEWED: CHART IN EPIC  Family History reviewed for pertinent findings.  Review of Systems  Constitutional: Negative.  Negative for activity change, fatigue and fever.  HENT: Negative.   Eyes: Negative.   Respiratory: Negative.  Negative for cough.   Cardiovascular: Negative.  Negative for chest pain.    Gastrointestinal: Negative.  Negative for abdominal pain.  Endocrine: Negative.   Genitourinary: Negative.  Negative for dysuria.  Musculoskeletal: Positive for back pain and myalgias.  Skin: Negative.   Neurological: Negative.     Allergies as of 07/12/2017      Reactions   Neosporin [neomycin-bacitracin Zn-polymyx] Itching, Rash      Medication List        Accurate as of 07/12/17 11:59 PM. Always use your most recent med list.          atorvastatin 10 MG tablet Commonly known as:  LIPITOR Take 10 mg by mouth daily.   cyclobenzaprine 10 MG tablet Commonly known as:  FLEXERIL Take 1 tablet (10 mg total) by mouth 3 (three) times daily as needed for muscle spasms.   ELIQUIS 5 MG Tabs tablet Generic drug:  apixaban Take 5 mg by mouth 2 (two) times daily.   lisinopril 20 MG tablet Commonly known as:  PRINIVIL,ZESTRIL Take 1 tablet (20 mg total) by mouth daily.   topiramate 50 MG tablet Commonly known as:  TOPAMAX Take 1 tablet (50 mg total) by mouth 2 (two) times daily.   triamcinolone  cream 0.5 % Commonly known as:  KENALOG Apply 1 application topically 3 (three) times daily.   Vitamin D (Ergocalciferol) 50000 units Caps capsule Commonly known as:  DRISDOL Take 1 capsule (50,000 Units total) by mouth every 7 (seven) days.          Objective:    BP (!) 183/83   Pulse 64   Temp (!) 97 F (36.1 C) (Oral)   Ht _0  (1.6 m)   Wt 181 lb (82.1 kg)   BMI 32.06 kg/m    Allergies  Allergen Reactions  . Neosporin [Neomycin-Bacitracin Zn-Polymyx] Itching and Rash    Wt Readings from Last 3 Encounters:  07/12/17 181 lb (82.1 kg)  05/25/17 181 lb (82.1 kg)  04/27/17 180 lb (81.6 kg)    Physical Exam  Constitutional: She is oriented to person, place, and time. She appears well-developed and well-nourished.  HENT:  Head: Normocephalic and atraumatic.  Eyes: Pupils are equal, round, and reactive to light. Conjunctivae and EOM are normal.  Cardiovascular:  Normal rate, regular rhythm, normal heart sounds and intact distal pulses.  Pulmonary/Chest: Effort normal and breath sounds normal.  Abdominal: Soft. Bowel sounds are normal.  Musculoskeletal:       Lumbar back: She exhibits tenderness, pain and spasm. She exhibits no swelling and no deformity.       Back:  Neurological: She is alert and oriented to person, place, and time. She has normal reflexes.  Skin: Skin is warm and dry. No rash noted.  Psychiatric: She has a normal mood and affect. Her behavior is normal. Judgment and thought content normal.    Results for orders placed or performed in visit on 07/12/17  [facility]  Result Value Ref Range   Glucose 94 65 - 99 mg/dL   BUN 12 8 - 27 mg/dL   Creatinine, Ser 0.86 0.57 - 1.00 mg/dL   GFR calc non Af Amer 68 >59 mL/min/1.73   GFR calc Af Amer 79 >59 mL/min/1.73   BUN/Creatinine Ratio 14 12 - 28   Sodium 142 134 - 144 mmol/L   Potassium 4.1 3.5 - 5.2 mmol/L   Chloride 106 96 - 106 mmol/L   CO2 23 20 - 29 mmol/L   Calcium 8.8 8.7 - 10.3 mg/dL  Microalbumin / creatinine urine ratio  Result Value Ref Range   Creatinine, Urine 91.0 Not Estab. mg/dL   Microalbumin, Urine 64.1 Not Estab. ug/mL   Microalb/Creat Ratio 70.4 (H) 0.0 - 30.0 mg/g creat      Assessment & Plan:   1. Acute low back pain, unspecified back pain laterality, with sciatica presence unspecified - DG Lumbar Spine 2-3 Views; Future - BMP8+EGFR - Microalbumin / creatinine urine ratio  2. Renal lithiasis - BMP8+EGFR - Microalbumin / creatinine urine ratio  3. Essential hypertension - BMP8+EGFR - Microalbumin / creatinine urine ratio - lisinopril (PRINIVIL,ZESTRIL) 20 MG tablet; Take 1 tablet (20 mg total) by mouth daily.  Dispense: 90 tablet; Refill: 3  4. Vitamin D deficiency - Vitamin D, Ergocalciferol, (DRISDOL) 50000 units CAPS capsule; Take 1 capsule (50,000 Units total) by mouth every 7 (seven) days.  Dispense: 13 capsule; Refill: 3  5. Abdominal  distension (gaseous) - CT Abdomen Pelvis Wo Contrast; Future  6. Abdominal pain, unspecified abdominal location - CT Abdomen Pelvis Wo Contrast; Future   Continue all other maintenance medications as listed above.  Follow up plan: Return in about 1 month (around 08/09/2017) for recheck.  Educational handout given for [employer]  Adah Salvage PA-C [place] [address]  [place], [place] [address] [phone]   07/13/2017, 1:56 PM

## 2017-08-08 ENCOUNTER — Ambulatory Visit: Payer: Medicare Other

## 2017-08-10 ENCOUNTER — Encounter: Payer: Self-pay | Admitting: Physician Assistant

## 2017-08-17 ENCOUNTER — Encounter: Payer: Self-pay | Admitting: Physician Assistant

## 2017-08-17 ENCOUNTER — Ambulatory Visit (INDEPENDENT_AMBULATORY_CARE_PROVIDER_SITE_OTHER): Payer: Medicare Other | Admitting: Physician Assistant

## 2017-08-17 ENCOUNTER — Encounter: Payer: Self-pay | Admitting: Gastroenterology

## 2017-08-17 VITALS — BP 173/82 | HR 77 | Temp 98.7°F | Ht 63.0 in | Wt 183.0 lb

## 2017-08-17 DIAGNOSIS — I10 Essential (primary) hypertension: Secondary | ICD-10-CM

## 2017-08-17 DIAGNOSIS — M79605 Pain in left leg: Secondary | ICD-10-CM | POA: Diagnosis not present

## 2017-08-17 DIAGNOSIS — M545 Low back pain: Secondary | ICD-10-CM

## 2017-08-17 DIAGNOSIS — D734 Cyst of spleen: Secondary | ICD-10-CM

## 2017-08-17 MED ORDER — CARVEDILOL 6.25 MG PO TABS: 6.2500 mg | ORAL_TABLET | Freq: Two times a day (BID) | ORAL | 2 refills | Status: DC

## 2017-08-19 NOTE — Progress Notes (Addendum)
BP (!) 173/82   Pulse 77   Temp 98.7 F (37.1 C) (Oral)   Ht '5\' 3"'$  (1.6 m)   Wt 183 lb (83 kg)   BMI 32.42 kg/m    Subjective:    Patient ID: Jaclyn Lowe, female    DOB: August 26, 1945, 72 y.o.   MRN: [id number]  HPI: Jaclyn Lowe is a 71 y.o. female presenting on 08/17/2017 for Back Pain (1 month follow up ) and Discuss labs and CT scan  Patient comes in for 1 month recheck on her back pain from a fall.  All we x-rayed her we were able to find some loose gallstones in the abdomen.  It was confirmed on CT.  She is not having any difficulty with this.  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  We will plan to follow-up with ultrasound on this.  Her labs are also reviewed and her blood pressure.  We will increase her blood pressure medicine.  And we will recheck her in 1 month.  Past Medical History:  Diagnosis Date  . A-fib ([place])   . Back pain   . Bell's palsy 2010  . Cancer ([place])    basal cell on the nose  . Edema    feet and legs  . Gallbladder disease   . HTN (hypertension)   . Hyperlipidemia   . Joint pain   . Loss of smell   . Loss of taste   . Palpitations   . Prediabetes   . Rectoceles   . SOB (shortness of breath)   . Swallowing difficulty   . Varicose vein of leg    Relevant past medical, surgical, family and social history reviewed and updated as indicated. Interim medical history since our last visit reviewed. Allergies and medications reviewed and updated. DATA REVIEWED: CHART IN EPIC  Family History reviewed for pertinent findings.  Review of Systems  Constitutional: Negative.  Negative for activity change, fatigue and fever.  HENT: Negative.   Eyes: Negative.   Respiratory: Negative.  Negative for cough.   Cardiovascular: Negative.  Negative for chest pain.  Gastrointestinal: Negative.  Negative for abdominal pain.  Endocrine: Negative.   Genitourinary: Negative.  Negative for dysuria.  Musculoskeletal: Positive for arthralgias and back pain.    Skin: Negative.   Neurological: Negative.     Allergies as of 08/17/2017      Reactions   Neosporin [neomycin-bacitracin Zn-polymyx] Itching, Rash      Medication List        Accurate as of 08/17/17 11:59 PM. Always use your most recent med list.          atorvastatin 10 MG tablet Commonly known as:  LIPITOR Take 10 mg by mouth daily.   carvedilol 6.25 MG tablet Commonly known as:  COREG Take 1 tablet (6.25 mg total) by mouth 2 (two) times daily with a meal.   cyclobenzaprine 10 MG tablet Commonly known as:  FLEXERIL Take 1 tablet (10 mg total) by mouth 3 (three) times daily as needed for muscle spasms.   ELIQUIS 5 MG Tabs tablet Generic drug:  apixaban Take 5 mg by mouth 2 (two) times daily.   lisinopril 20 MG tablet Commonly known as:  PRINIVIL,ZESTRIL Take 1 tablet (20 mg total) by mouth daily.   topiramate 50 MG tablet Commonly known as:  TOPAMAX Take 1 tablet (50 mg total) by mouth 2 (two) times daily.   triamcinolone cream 0.5 % Commonly known as:  KENALOG  Apply 1 application topically 3 (three) times daily.   Vitamin D (Ergocalciferol) 50000 units Caps capsule Commonly known as:  DRISDOL Take 1 capsule (50,000 Units total) by mouth every 7 (seven) days.          Objective:    BP (!) 173/82   Pulse 77   Temp 98.7 F (37.1 C) (Oral)   Ht '5\' 3"'$  (1.6 m)   Wt 183 lb (83 kg)   BMI 32.42 kg/m   Allergies  Allergen Reactions  . Neosporin [Neomycin-Bacitracin Zn-Polymyx] Itching and Rash    Wt Readings from Last 3 Encounters:  08/17/17 183 lb (83 kg)  07/12/17 181 lb (82.1 kg)  05/25/17 181 lb (82.1 kg)    Physical Exam  Constitutional: She is oriented to person, place, and time. She appears well-developed and well-nourished.  HENT:  Head: Normocephalic and atraumatic.  Eyes: Pupils are equal, round, and reactive to light. Conjunctivae and EOM are normal.  Cardiovascular: Normal rate, regular rhythm, normal heart sounds and intact distal  pulses.  Pulmonary/Chest: Effort normal and breath sounds normal.  Abdominal: Soft. Bowel sounds are normal.  Neurological: She is alert and oriented to person, place, and time. She has normal reflexes.  Skin: Skin is warm and dry. No rash noted.  Psychiatric: She has a normal mood and affect. Her behavior is normal. Judgment and thought content normal.    Results for orders placed or performed in visit on 07/12/17  [facility]  Result Value Ref Range   Glucose 94 65 - 99 mg/dL   BUN 12 8 - 27 mg/dL   Creatinine, Ser 0.86 0.57 - 1.00 mg/dL   GFR calc non Af Amer 68 >59 mL/min/1.73   GFR calc Af Amer 79 >59 mL/min/1.73   BUN/Creatinine Ratio 14 12 - 28   Sodium 142 134 - 144 mmol/L   Potassium 4.1 3.5 - 5.2 mmol/L   Chloride 106 96 - 106 mmol/L   CO2 23 20 - 29 mmol/L   Calcium 8.8 8.7 - 10.3 mg/dL  Microalbumin / creatinine urine ratio  Result Value Ref Range   Creatinine, Urine 91.0 Not Estab. mg/dL   Microalbumin, Urine 64.1 Not Estab. ug/mL   Microalb/Creat Ratio 70.4 (H) 0.0 - 30.0 mg/g creat      Assessment & Plan:   1. Essential hypertension - carvedilol (COREG) 6.25 MG tablet; Take 1 tablet (6.25 mg total) by mouth 2 (two) times daily with a meal.  Dispense: 60 tablet; Refill: 2  2. Lumbar pain with radiation down left leg - Ambulatory referral to Physical Therapy  3. Cyst of spleen - US Abdomen Limited; Future    Continue all other maintenance medications as listed above.  Follow up plan: Return in about 1 month (around 09/14/2017) for recheck.  Educational handout given for [place] PA-C [place] [address]  [place], [place] [address] [phone]   08/19/2017, 6:10 PM

## 2017-08-26 ENCOUNTER — Ambulatory Visit (HOSPITAL_COMMUNITY): Payer: Medicare Other

## 2017-09-02 ENCOUNTER — Ambulatory Visit (HOSPITAL_COMMUNITY)
Admission: RE | Admit: 2017-09-02 | Discharge: 2017-09-02 | Disposition: A | Discharge status: Home / Self Care | Payer: Medicare Other | Source: Ambulatory Visit | Attending: Physician Assistant | Admitting: Physician Assistant

## 2017-09-02 DIAGNOSIS — D7389 Other diseases of spleen: Secondary | ICD-10-CM | POA: Diagnosis not present

## 2017-09-02 DIAGNOSIS — D734 Cyst of spleen: Secondary | ICD-10-CM | POA: Diagnosis not present

## 2017-09-06 ENCOUNTER — Ambulatory Visit (INDEPENDENT_AMBULATORY_CARE_PROVIDER_SITE_OTHER): Payer: Medicare Other | Admitting: Gastroenterology

## 2017-09-06 ENCOUNTER — Telehealth: Payer: Self-pay | Admitting: Emergency Medicine

## 2017-09-06 ENCOUNTER — Encounter: Payer: Self-pay | Admitting: Gastroenterology

## 2017-09-06 VITALS — BP 154/84 | HR 50 | Ht 63.0 in | Wt 188.0 lb

## 2017-09-06 DIAGNOSIS — Z1211 Encounter for screening for malignant neoplasm of colon: Secondary | ICD-10-CM | POA: Diagnosis not present

## 2017-09-06 DIAGNOSIS — Z7901 Long term (current) use of anticoagulants: Secondary | ICD-10-CM | POA: Diagnosis not present

## 2017-09-06 MED ORDER — NA SULFATE-K SULFATE-MG SULF 17.5-3.13-1.6 GM/177ML PO SOLN: 1.0000 | ORAL | 0 refills | Status: DC

## 2017-09-06 NOTE — Patient Instructions (Signed)

## 2017-09-06 NOTE — Telephone Encounter (Signed)
   Jaclyn Lowe [birth year] [id number]  Dear Dr. May :  We have scheduled the above named patient for a colonoscopy procedure. Our records show that she is on anticoagulation therapy.  Please advise as to whether the patient may come off their therapy of Eliquis 2 days prior to their procedure which is scheduled for 09-27-17.  Please route your response to Tinnie Gens, CMA or fax response to [phone].  Sincerely,    [facility]

## 2017-09-06 NOTE — Progress Notes (Addendum)
09/06/2017 Jaclyn Lowe [id number] 12-25-1945   HISTORY OF PRESENT ILLNESS:  This is a pleasant 72 year old female who is new to our practice.  She is here today to schedule colonoscopy.  Last was 10 years ago in [place] and was reportedly normal at that time.  She is requesting Dr. Hilarie Fredrickson as her daughter and husband both see him as well.  She is on Eliquis for atrial fibrillation and follows with Dr. May in [place], [place] through [facility].  Has held Eliquis for dental work in the past.  No GI complaints including rectal bleeding, change in bowel habits, etc.   Past Medical History:  Diagnosis Date  . A-fib ([place])   . Back pain   . Bell's palsy 2010  . Cancer ([place])    basal cell on the nose  . Edema    feet and legs  . Gallbladder disease   . HTN (hypertension)   . Hyperlipidemia   . Joint pain   . Loss of smell   . Loss of taste   . Palpitations   . Prediabetes   . Rectoceles   . SOB (shortness of breath)   . Swallowing difficulty   . Varicose vein of leg    Past Surgical History:  Procedure Laterality Date  . ANKLE SURGERY     left ankle  . AV FISTULA PLACEMENT     leg  . CHOLECYSTECTOMY    . GASTRIC BYPASS    . tummy tuck    . VEIN REPAIR      reports that she has quit smoking. She has never used smokeless tobacco. She reports that she drinks alcohol. She reports that she does not use drugs. family history includes Diabetes in her mother; Heart disease in her father; Hyperlipidemia in her father and mother; Hypertension in her father; Obesity in her mother; Sudden death in her father and mother. Allergies  Allergen Reactions  . Neosporin [Neomycin-Bacitracin Zn-Polymyx] Itching and Rash      Outpatient Encounter Medications as of 09/06/2017  Medication Sig  . apixaban (ELIQUIS) 5 MG TABS tablet Take 5 mg by mouth 2 (two) times daily.  Marland Kitchen atorvastatin (LIPITOR) 10 MG tablet Take 10 mg by mouth daily.  . carvedilol (COREG) 6.25 MG tablet Take 1 tablet (6.25 mg total) by  mouth 2 (two) times daily with a meal.  . cyclobenzaprine (FLEXERIL) 10 MG tablet Take 1 tablet (10 mg total) by mouth 3 (three) times daily as needed for muscle spasms.  . hydrochlorothiazide (HYDRODIURIL) 12.5 MG tablet Take 12.5 mg by mouth as needed.  Marland Kitchen lisinopril (PRINIVIL,ZESTRIL) 20 MG tablet Take 1 tablet (20 mg total) by mouth daily.  Marland Kitchen triamcinolone cream (KENALOG) 0.5 % Apply 1 application topically 3 (three) times daily.  . Vitamin D, Ergocalciferol, (DRISDOL) 50000 units CAPS capsule Take 1 capsule (50,000 Units total) by mouth every 7 (seven) days.  . [DISCONTINUED] topiramate (TOPAMAX) 50 MG tablet Take 1 tablet (50 mg total) by mouth 2 (two) times daily.   No facility-administered encounter medications on file as of 09/06/2017.      REVIEW OF SYSTEMS  : All other systems reviewed and negative except where noted in the History of Present Illness.   PHYSICAL EXAM: BP (!) 154/84   Pulse (!) 50   Ht 5\' 3"  (1.6 m)   Wt 188 lb (85.3 kg)   BMI 33.30 kg/m  General: Well developed white female in no acute distress Head: Normocephalic and atraumatic Eyes:  Sclerae anicteric, conjunctiva pink. Ears: Normal auditory acuity Lungs: Clear throughout to auscultation; no increased WOB. Heart: Irregularly irregular.   Abdomen: Soft, non-distended.  BS present.  Non-tender.  Scar noted on abdomen from previous surgery. Rectal:  Will be done at the time of colonoscopy. Musculoskeletal: Symmetrical with no gross deformities  Skin: No lesions on visible extremities Extremities: No edema  Neurological: Alert oriented x 4, grossly non-focal Psychological:  Alert and cooperative. Normal mood and affect  ASSESSMENT AND PLAN: *Screening colonoscopy:  Last was 10 years ago in [place], normal.  Will schedule with Dr. Hilarie Fredrickson at patient's request. *Chronic anticoagulation:  Will hold Eliquis for 2 days prior to endoscopic procedures - will instruct when and how to resume after procedure. Benefits and  risks of procedure explained including risks of bleeding, perforation, infection, missed lesions, reactions to medications and possible need for hospitalization and surgery for complications. Additional rare but real risk of stroke or other vascular clotting events off of Eliquis also explained and need to seek urgent help if any signs of these problems occur. Will communicate by phone or EMR with patient's prescribing provider, Dr. May, to confirm that holding Eliquis is reasonable in this case.    CC:  Terald Sleeper, PA-C   Addendum: Reviewed and agree with initial management. Pyrtle, Lajuan Lines, MD

## 2017-09-12 NOTE — Telephone Encounter (Signed)
Spoke to patient and informed her we received a fax from Dr. Lu Duffel stating she may hold her Eliquis 48 hours before procedure. Patient verbalized understanding. Will send fax to be scanned.

## 2017-09-19 ENCOUNTER — Ambulatory Visit: Payer: Medicare Other | Admitting: Physician Assistant

## 2017-09-22 ENCOUNTER — Other Ambulatory Visit: Payer: Self-pay

## 2017-09-22 ENCOUNTER — Ambulatory Visit: Payer: Medicare Other | Attending: Physician Assistant | Admitting: Physical Therapy

## 2017-09-22 ENCOUNTER — Encounter: Payer: Self-pay | Admitting: Physical Therapy

## 2017-09-22 DIAGNOSIS — M5442 Lumbago with sciatica, left side: Secondary | ICD-10-CM | POA: Diagnosis not present

## 2017-09-22 DIAGNOSIS — M6281 Muscle weakness (generalized): Secondary | ICD-10-CM | POA: Diagnosis not present

## 2017-09-22 DIAGNOSIS — G8929 Other chronic pain: Secondary | ICD-10-CM | POA: Diagnosis not present

## 2017-09-22 NOTE — Therapy (Signed)
[facility] [place], [place], [address] Phone: [phone]   Fax:  [phone]  Physical Therapy Evaluation  Patient Details  Name: Jaclyn Lowe MRN: [id number] Date of Birth: 1945-03-25 Referring Provider: Particia Nearing   Encounter Date: 09/22/2017  PT End of Session - 09/22/17 2118    Visit Number  1    Number of Visits  12    Date for PT Re-Evaluation  12/21/17    Authorization Type  progress note every 10th visit, KX modifier at 15th visit    PT Start Time  0900    PT Stop Time  0943    PT Time Calculation (min)  43 min    Activity Tolerance  Patient tolerated treatment well    Behavior During Therapy  [facility] for tasks assessed/performed       Past Medical History:  Diagnosis Date  . A-fib ([place])   . Back pain   . Bell's palsy 2010  . Cancer ([place])    basal cell on the nose  . Edema    feet and legs  . Gallbladder disease   . HTN (hypertension)   . Hyperlipidemia   . Joint pain   . Loss of smell   . Loss of taste   . Palpitations   . Prediabetes   . Rectoceles   . SOB (shortness of breath)   . Swallowing difficulty   . Varicose vein of leg     Past Surgical History:  Procedure Laterality Date  . ANKLE SURGERY     left ankle  . AV FISTULA PLACEMENT     leg  . CHOLECYSTECTOMY    . GASTRIC BYPASS    . tummy tuck    . VEIN REPAIR      There were no vitals filed for this visit.   Subjective Assessment - 09/22/17 2116    Subjective  Patient arrives to physical therapy with repots of low back pain that began about May 2019. Patient reported having a fall several years ago which fractured L4 and states her back pain may be caused by an exacerbation of her previous back injury. Patient reports intermittent numbness and tingling down the left lower extremity to the level of the knee. Patient reports pain at worst is 6/10 with bending and lifting. Pain at best is 0/10 with rest, ice, and Aleve. Patient's goals  are to decrease pain, have less difficulties with home activities, and improve movement.    Pertinent History  HTN, AFib    Limitations  Lifting;Walking;House hold activities    Diagnostic tests  x-ray; CT scan    Patient Stated Goals  get moving better    Currently in Pain?  Yes    Pain Score  4     Pain Location  Back    Pain Orientation  Left;Lower    Pain Descriptors / Indicators  Sore    Pain Type  Chronic pain    Pain Onset  More than a month ago    Pain Frequency  Intermittent    Aggravating Factors   bending and lifting    Pain Relieving Factors  rest, ice, aleve         [facility] PT Assessment - 09/22/17 0001      Assessment   Medical Diagnosis  lumbar pain with radiation down left leg    Referring Provider  Particia Nearing    Onset Date/Surgical Date  -- may 2019    Next MD Visit  August 2019    Prior Therapy  no      Precautions   Precautions  None      Restrictions   Weight Bearing Restrictions  No      Balance Screen   Has the patient fallen in the past 6 months  No    Has the patient had a decrease in activity level because of a fear of falling?   No    Is the patient reluctant to leave their home because of a fear of falling?   No      Home Film/video editor residence    Living Arrangements  Spouse/significant other    Type of [place] Access  Stairs to enter    Entrance Stairs-Number of Steps  2      Prior Function   Level of Independence  Independent    Vocation  Part time employment      ROM / Strength   AROM / PROM / Strength  AROM;Strength      AROM   AROM Assessment Site  Lumbar    Lumbar Flexion  10.5    Lumbar Extension  limited by 50%    Lumbar - Right Side Bend  20.5    Lumbar - Left Side Bend  20.5      Strength   Strength Assessment Site  Hip;Knee    Right/Left Hip  Right;Left    Right Hip Flexion  4+/5    Right Hip Extension  3/5    Right Hip ABduction  4-/5    Left Hip Flexion  4+/5    Left Hip  Extension  3/5    Left Hip ABduction  3/5    Right/Left Knee  Right;Left    Right Knee Flexion  4+/5    Right Knee Extension  4+/5    Left Knee Flexion  4/5    Left Knee Extension  4/5      Special Tests    Special Tests  Lumbar    Lumbar Tests  Straight Leg Raise      Straight Leg Raise   Findings  Positive    Side   Left                Objective measurements completed on examination: See above findings.              PT Education - 09/22/17 2118    Education Details  draw ins, scapular retractions, glute isometric    Person(s) Educated  Patient    Methods  Explanation;Demonstration;Handout    Comprehension  Verbalized understanding;Returned demonstration       PT Short Term Goals - 09/22/17 2125      PT SHORT TERM GOAL #1   Title  STG=LTG        PT Long Term Goals - 09/22/17 2125      PT LONG TERM GOAL #1   Title  Patient will be independent with HEP    Time  6    Period  Weeks    Status  New      PT LONG TERM GOAL #2   Title  Patient will improve bilateral LE strength to 4+/5 or greater to improve stability during gait and functional activities.    Time  6    Period  Weeks    Status  New      PT LONG TERM GOAL #3   Title  Patient  will report no neurological symptoms down left LE to indicate no nerve irritation.    Time  6    Period  Weeks    Status  New      PT LONG TERM GOAL #4   Title  Patient will demonstrate proper lifting body mechanics to protect back while lifting/carrying grandchild.    Time  6    Period  Weeks    Status  New      PT LONG TERM GOAL #5   Title  Patient will report ability to perform ADLs and home activities with less than or equal to 3/10 pain.    Time  6    Period  Weeks    Status  New             Plan - 09/22/17 2122    Clinical Impression Statement  Patient is a 72 year old female who presents to physical therapy with reports of low back pain and decreased LE strength. Patient demonstrates  decreased lumbar AROM in all planes with reports of pain during flexion. Patient with normal sensation. Patient noted with increased time to perform bed mobility. Patient would benefit from skilled physical therapy to address deficits and patient's goals.     History and Personal Factors relevant to plan of care:  HTN, AFib    Clinical Presentation  Evolving    Clinical Decision Making  Low    Rehab Potential  Good    PT Frequency  2x / week    PT Duration  6 weeks    PT Treatment/Interventions  ADLs/Self Care Home Management;Cryotherapy;Electrical Stimulation;Traction;Moist Heat;Iontophoresis 4mg /ml Dexamethasone;Neuromuscular re-education;Therapeutic exercise;Balance training;Taping;Dry needling;Manual techniques;Passive range of motion;Ultrasound;Therapeutic activities;Patient/family education    PT Next Visit Plan  nustep, core stabilization, hip strengthening, modalities PRN for pain relief    PT Home Exercise Plan  see patient education section    Consulted and Agree with Plan of Care  Patient       Patient will benefit from skilled therapeutic intervention in order to improve the following deficits and impairments:  Pain, Decreased activity tolerance, Decreased endurance, Decreased range of motion, Decreased strength, Difficulty walking, Postural dysfunction  Visit Diagnosis: Chronic bilateral low back pain with left-sided sciatica - Plan: PT plan of care cert/re-cert  Muscle weakness (generalized) - Plan: PT plan of care cert/re-cert     Problem List Patient Active Problem List   Diagnosis Date Noted  . Special screening for malignant neoplasms, colon 09/06/2017  . Chronic anticoagulation 09/06/2017  . Prediabetes 01/03/2017  . Class 1 obesity with serious comorbidity and body mass index (BMI) of 31.0 to 31.9 in adult 01/03/2017  . Intrinsic eczema 07/14/2016  . Cellulitis of lower extremity 07/14/2016  . Class 1 obesity with serious comorbidity and body mass index (BMI) of  33.0 to 33.9 in adult 07/12/2016  . Essential hypertension 05/20/2016  . Depression 05/20/2016  . Class 2 obesity without serious comorbidity with body mass index (BMI) of 38.0 to 38.9 in adult 05/20/2016  . Class 2 obesity with serious comorbidity and body mass index (BMI) of 39.0 to 39.9 in adult 04/21/2016  . Shortness of breath on exertion 04/21/2016  . Fatigue 04/21/2016  . Vitamin D deficiency 04/21/2016  . Status post gastric bypass for obesity 04/21/2016   Gabriela Eves, PT, DPT 09/22/2017, 9:45 PM  [facility] [address] [place], [place], [address] Phone: [phone]   Fax:  [phone]  Name: Kenitra Leventhal MRN: [id number] Date of  Birth: 1945/09/20

## 2017-09-26 ENCOUNTER — Ambulatory Visit: Payer: Medicare Other | Admitting: Physical Therapy

## 2017-09-26 DIAGNOSIS — M6281 Muscle weakness (generalized): Secondary | ICD-10-CM

## 2017-09-26 DIAGNOSIS — M5442 Lumbago with sciatica, left side: Secondary | ICD-10-CM | POA: Diagnosis not present

## 2017-09-26 DIAGNOSIS — G8929 Other chronic pain: Secondary | ICD-10-CM

## 2017-09-26 NOTE — Therapy (Signed)
[facility] [place], [place], [address] Phone: [phone]   Fax:  [phone]  Physical Therapy Treatment  Patient Details  Name: Jaclyn Lowe MRN: [id number] Date of Birth: [birth year] Referring Provider: Particia Nearing   Encounter Date: 09/26/2017  PT End of Session - 09/26/17 1034    Visit Number  2    Number of Visits  12    Date for PT Re-Evaluation  12/21/17    Authorization Type  progress note every 10th visit, KX modifier at 15th visit    PT Start Time  1034    PT Stop Time  1123    PT Time Calculation (min)  49 min    Activity Tolerance  Patient tolerated treatment well    Behavior During Therapy  [facility] for tasks assessed/performed       Past Medical History:  Diagnosis Date  . A-fib ([place])   . Back pain   . Bell's palsy 2010  . Cancer ([place])    basal cell on the nose  . Edema    feet and legs  . Gallbladder disease   . HTN (hypertension)   . Hyperlipidemia   . Joint pain   . Loss of smell   . Loss of taste   . Palpitations   . Prediabetes   . Rectoceles   . SOB (shortness of breath)   . Swallowing difficulty   . Varicose vein of leg     Past Surgical History:  Procedure Laterality Date  . ANKLE SURGERY     left ankle  . AV FISTULA PLACEMENT     leg  . CHOLECYSTECTOMY    . GASTRIC BYPASS    . tummy tuck    . VEIN REPAIR      There were no vitals filed for this visit.  Subjective Assessment - 09/26/17 1243    Subjective  Patient arrives to PT feeling "alright."    Pertinent History  HTN, AFib    Limitations  Lifting;Walking;House hold activities    Diagnostic tests  x-ray; CT scan    Patient Stated Goals  get moving better    Currently in Pain?  Yes did not provide pain scale         [facility] PT Assessment - 09/26/17 0001      Assessment   Medical Diagnosis  lumbar pain with radiation down left leg                   [facility] Adult PT Treatment/Exercise - 09/26/17 0001      Exercises   Exercises  Lumbar      Lumbar Exercises: Aerobic   Nustep  Level 3 x15 minutes UE/LE      Lumbar Exercises: Supine   Clam  20 reps;3 seconds red TB    Bridge  20 reps;2 seconds;Compliant    Straight Leg Raise  20 reps    Other Supine Lumbar Exercises  hip adduction ball squeeze 5" hold x20      Modalities   Modalities  Electrical Stimulation;Moist Heat      Moist Heat Therapy   Number Minutes Moist Heat  10 Minutes    Moist Heat Location  Lumbar Spine      Electrical Stimulation   Electrical Stimulation Location  L low back    Electrical Stimulation Action  IFC    Electrical Stimulation Parameters  80-150 hz x10 min    Electrical Stimulation Goals  Pain  PT Short Term Goals - 09/22/17 2125      PT SHORT TERM GOAL #1   Title  STG=LTG        PT Long Term Goals - 09/22/17 2125      PT LONG TERM GOAL #1   Title  Patient will be independent with HEP    Time  6    Period  Weeks    Status  New      PT LONG TERM GOAL #2   Title  Patient will improve bilateral LE strength to 4+/5 or greater to improve stability during gait and functional activities.    Time  6    Period  Weeks    Status  New      PT LONG TERM GOAL #3   Title  Patient will report no neurological symptoms down left LE to indicate no nerve irritation.    Time  6    Period  Weeks    Status  New      PT LONG TERM GOAL #4   Title  Patient will demonstrate proper lifting body mechanics to protect back while lifting/carrying grandchild.    Time  6    Period  Weeks    Status  New      PT LONG TERM GOAL #5   Title  Patient will report ability to perform ADLs and home activities with less than or equal to 3/10 pain.    Time  6    Period  Weeks    Status  New            Plan - 09/26/17 1114    Clinical Impression Statement  Patient was able to tolerate treatment well with some reports of muscle fatigue. Patient was able to demonstrate good form after inital  explanation. Patient educated she may feel sore after session to which is normal. Normal response to modalities upon removal.     Clinical Presentation  Evolving    Clinical Decision Making  Low    Rehab Potential  Good    PT Frequency  2x / week    PT Duration  6 weeks    PT Treatment/Interventions  ADLs/Self Care Home Management;Cryotherapy;Electrical Stimulation;Traction;Moist Heat;Iontophoresis 4mg /ml Dexamethasone;Neuromuscular re-education;Therapeutic exercise;Balance training;Taping;Dry needling;Manual techniques;Passive range of motion;Ultrasound;Therapeutic activities;Patient/family education    PT Next Visit Plan  nustep, core stabilization, hip strengthening, modalities PRN for pain relief    Consulted and Agree with Plan of Care  Patient       Patient will benefit from skilled therapeutic intervention in order to improve the following deficits and impairments:  Pain, Decreased activity tolerance, Decreased endurance, Decreased range of motion, Decreased strength, Difficulty walking, Postural dysfunction  Visit Diagnosis: Chronic bilateral low back pain with left-sided sciatica  Muscle weakness (generalized)     Problem List Patient Active Problem List   Diagnosis Date Noted  . Special screening for malignant neoplasms, colon 09/06/2017  . Chronic anticoagulation 09/06/2017  . Prediabetes 01/03/2017  . Class 1 obesity with serious comorbidity and body mass index (BMI) of 31.0 to 31.9 in adult 01/03/2017  . Intrinsic eczema 07/14/2016  . Cellulitis of lower extremity 07/14/2016  . Class 1 obesity with serious comorbidity and body mass index (BMI) of 33.0 to 33.9 in adult 07/12/2016  . Essential hypertension 05/20/2016  . Depression 05/20/2016  . Class 2 obesity without serious comorbidity with body mass index (BMI) of 38.0 to 38.9 in adult 05/20/2016  . Class 2 obesity with serious comorbidity and  body mass index (BMI) of 39.0 to 39.9 in adult 04/21/2016  . Shortness of  breath on exertion 04/21/2016  . Fatigue 04/21/2016  . Vitamin D deficiency 04/21/2016  . Status post gastric bypass for obesity 04/21/2016   Gabriela Eves, PT, DPT 09/26/2017, 12:43 PM  [facility] [address] [place], [place], [address] Phone: [phone]   Fax:  [phone]  Name: Jaclyn Lowe MRN: [id number] Date of Birth: 1945-08-26

## 2017-09-27 ENCOUNTER — Ambulatory Visit (AMBULATORY_SURGERY_CENTER): Payer: Medicare Other | Admitting: Internal Medicine

## 2017-09-27 ENCOUNTER — Encounter: Payer: Self-pay | Admitting: Internal Medicine

## 2017-09-27 VITALS — BP 159/86 | HR 60 | Temp 98.4°F | Resp 12 | Ht 63.0 in | Wt 188.0 lb

## 2017-09-27 DIAGNOSIS — D1801 Hemangioma of skin and subcutaneous tissue: Secondary | ICD-10-CM | POA: Diagnosis not present

## 2017-09-27 DIAGNOSIS — Z1211 Encounter for screening for malignant neoplasm of colon: Secondary | ICD-10-CM

## 2017-09-27 DIAGNOSIS — K632 Fistula of intestine: Secondary | ICD-10-CM

## 2017-09-27 DIAGNOSIS — D225 Melanocytic nevi of trunk: Secondary | ICD-10-CM | POA: Diagnosis not present

## 2017-09-27 DIAGNOSIS — Z85828 Personal history of other malignant neoplasm of skin: Secondary | ICD-10-CM | POA: Diagnosis not present

## 2017-09-27 DIAGNOSIS — L821 Other seborrheic keratosis: Secondary | ICD-10-CM | POA: Diagnosis not present

## 2017-09-27 DIAGNOSIS — I8393 Asymptomatic varicose veins of bilateral lower extremities: Secondary | ICD-10-CM | POA: Diagnosis not present

## 2017-09-27 MED ORDER — SODIUM CHLORIDE 0.9 % IV SOLN: 500.0000 mL | Freq: Once | INTRAVENOUS | Status: DC

## 2017-09-27 NOTE — Patient Instructions (Addendum)
YOU HAD AN ENDOSCOPIC PROCEDURE TODAY AT [facility]:   Refer to the procedure report that was given to you for any specific questions about what was found during the examination.  If the procedure report does not answer your questions, please call your gastroenterologist to clarify.  If you requested that your care partner not be given the details of your procedure findings, then the procedure report has been included in a sealed envelope for you to review at your convenience later.  YOU SHOULD EXPECT: Some feelings of bloating in the abdomen. Passage of more gas than usual.  Walking can help get rid of the air that was put into your GI tract during the procedure and reduce the bloating. If you had a lower endoscopy (such as a colonoscopy or flexible sigmoidoscopy) you may notice spotting of blood in your stool or on the toilet paper. If you underwent a bowel prep for your procedure, you may not have a normal bowel movement for a few days.  Please Note:  You might notice some irritation and congestion in your nose or some drainage.  This is from the oxygen used during your procedure.  There is no need for concern and it should clear up in a day or so.  SYMPTOMS TO REPORT IMMEDIATELY:   Following lower endoscopy (colonoscopy or flexible sigmoidoscopy):  Excessive amounts of blood in the stool  Significant tenderness or worsening of abdominal pains  Swelling of the abdomen that is new, acute  Fever of 100F or higher   For urgent or emergent issues, a gastroenterologist can be reached at any hour by calling [phone].   DIET:  We do recommend a small meal at first, but then you may proceed to your regular diet.  Drink plenty of fluids but you should avoid alcoholic beverages for 24 hours.  ACTIVITY:  You should plan to take it easy for the rest of today and you should NOT DRIVE or use heavy machinery until tomorrow (because of the sedation medicines used during the test).     FOLLOW UP: Our staff will call the number listed on your records the next business day following your procedure to check on you and address any questions or concerns that you may have regarding the information given to you following your procedure. If we do not reach you, we will leave a message.  However, if you are feeling well and you are not experiencing any problems, there is no need to return our call.  We will assume that you have returned to your regular daily activities without incident.  If any biopsies were taken you will be contacted by phone or by letter within the next 1-3 weeks.  Please call us at [phone] if you have not heard about the biopsies in 3 weeks.    SIGNATURES/CONFIDENTIALITY: You and/or your care partner have signed paperwork which will be entered into your electronic medical record.  These signatures attest to the fact that that the information above on your After Visit Summary has been reviewed and is understood.  Full responsibility of the confidentiality of this discharge information lies with you and/or your care-partner.    Handouts were given to your care partner on diverticulosis  and hemorrhoids. Per Dr. Hilarie Fredrickson resume your ELIQUIS (apixaban) at prior dose today. You may resume your other current medications today. Await biopsy results. Please call if any questions or concerns.

## 2017-09-27 NOTE — Op Note (Addendum)
[place] [facility] Patient Name: Jaclyn Lowe Procedure Date: 09/27/2017 10:32 AM MRN: [id number] Endoscopist: Jerene Bears , MD Age: 72 Referring MD:  Date of Birth: 1945/07/18 Gender: Female Account #: [id number] Procedure:                Colonoscopy Indications:              Screening for colorectal malignant neoplasm, Last                            colonoscopy 10 years ago Medicines:                Monitored Anesthesia Care Procedure:                Pre-Anesthesia Assessment:                           - Prior to the procedure, a History and Physical                            was performed, and patient medications and                            allergies were reviewed. The patient's tolerance of                            previous anesthesia was also reviewed. The risks                            and benefits of the procedure and the sedation                            options and risks were discussed with the patient.                            All questions were answered, and informed consent                            was obtained. Prior Anticoagulants: The patient has                            taken Eliquis (apixaban), last dose was 2 days                            prior to procedure. ASA Grade Assessment: III - A                            patient with severe systemic disease. After                            reviewing the risks and benefits, the patient was                            deemed in satisfactory condition to undergo the  procedure.                           After obtaining informed consent, the colonoscope                            was passed under direct vision. Throughout the                            procedure, the patient's blood pressure, pulse, and                            oxygen saturations were monitored continuously. The                            Colonoscope was introduced through the anus and   advanced to the cecum, identified by appendiceal                            orifice and ileocecal valve. The colonoscopy was                            performed without difficulty. The patient tolerated                            the procedure well. The quality of the bowel                            preparation was good. The ileocecal valve,                            appendiceal orifice, and rectum were photographed. Scope In: 11:09:09 AM Scope Out: 11:34:20 AM Scope Withdrawal Time: 0 hours 14 minutes 20 seconds  Total Procedure Duration: 0 hours 25 minutes 11 seconds  Findings:                 The digital rectal exam was normal.                           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 There was clearly small bowel mucosa present                            and there was patchy inflammation in this segment                            (erythema and erosion). Biopsies were taken with a                            cold forceps for histology. There were adjacent                            diverticuli, but very  small. Query sequela of                            previous diverticulitis (though cannot exclude                            Crohn's, though neither seen on CT from May 2019).                           Multiple small-mouthed diverticula were found in                            the recto-sigmoid colon, sigmoid colon, descending                            colon and transverse colon.                           Internal hemorrhoids were found during                            retroflexion. The hemorrhoids were small.                           The exam was otherwise without abnormality. Complications:            No immediate complications. Estimated Blood Loss:     Estimated blood loss was minimal. 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                            Biopsies.                           - Mild  diverticulosis in the recto-sigmoid colon,                            in the sigmoid colon, in the descending colon and                            in the transverse colon.                           - Internal hemorrhoids.                           - The examination was otherwise normal. Recommendation:           - Patient has a contact number available for                            emergencies. The signs and symptoms of potential                            delayed complications were discussed with the  patient. Return to normal activities tomorrow.                            Written discharge instructions were provided to the                            patient.                           - Resume previous diet.                           - Continue present medications.                           - Resume Eliquis (apixaban) at prior dose today.                           - Await pathology results.                           - Consider small bowel follow-up through versus CT                            enterography.                           - Repeat colonoscopy is recommended. The                            colonoscopy date will be determined after pathology                            results from today's exam become available for                            review. Jerene Bears, MD 09/27/2017 11:43:56 AM This report has been signed electronically.

## 2017-09-27 NOTE — Progress Notes (Signed)
No problems noted in the recovery room. maw 

## 2017-09-27 NOTE — Progress Notes (Signed)
To PACU, VSS. Report to RN.tb 

## 2017-09-27 NOTE — Progress Notes (Signed)
Called to room to assist during endoscopic procedure.  Patient ID and intended procedure confirmed with present staff. Received instructions for my participation in the procedure from the performing physician.  

## 2017-09-28 ENCOUNTER — Telehealth: Payer: Self-pay

## 2017-09-28 NOTE — Telephone Encounter (Signed)
  Follow up Call-  Call back number 09/27/2017  Post procedure Call Back phone  # [phone]  Permission to leave phone message Yes     Patient questions:  Do you have a fever, pain , or abdominal swelling? No. Pain Score  0 *  Have you tolerated food without any problems? Yes.    Have you been able to return to your normal activities? Yes.    Do you have any questions about your discharge instructions: Diet   No. Medications  No. Follow up visit  No.  Do you have questions or concerns about your Care? No.  Actions: * If pain score is 4 or above: No action needed, pain <4.

## 2017-09-30 ENCOUNTER — Other Ambulatory Visit: Payer: Self-pay

## 2017-09-30 ENCOUNTER — Encounter: Payer: Self-pay | Admitting: Physical Therapy

## 2017-09-30 ENCOUNTER — Ambulatory Visit: Payer: Medicare Other | Attending: Physician Assistant | Admitting: Physical Therapy

## 2017-09-30 DIAGNOSIS — M6281 Muscle weakness (generalized): Secondary | ICD-10-CM | POA: Insufficient documentation

## 2017-09-30 DIAGNOSIS — R109 Unspecified abdominal pain: Secondary | ICD-10-CM

## 2017-09-30 DIAGNOSIS — G8929 Other chronic pain: Secondary | ICD-10-CM | POA: Diagnosis not present

## 2017-09-30 DIAGNOSIS — M5442 Lumbago with sciatica, left side: Secondary | ICD-10-CM | POA: Diagnosis not present

## 2017-09-30 NOTE — Therapy (Signed)
[facility] [place], [place], [address] Phone: [phone]   Fax:  [phone]  Physical Therapy Treatment  Patient Details  Name: Jaclyn Lowe MRN: [id number] Date of Birth: [birth year] Referring Provider: Particia Nearing   Encounter Date: 09/30/2017  PT End of Session - 09/30/17 1132    Visit Number  3    Number of Visits  12    Date for PT Re-Evaluation  12/21/17    Authorization Type  progress note every 10th visit, KX modifier at 15th visit    PT Start Time  1129 Late arrival    PT Stop Time  1210    PT Time Calculation (min)  41 min    Activity Tolerance  Patient tolerated treatment well    Behavior During Therapy  [facility] for tasks assessed/performed       Past Medical History:  Diagnosis Date  . A-fib ([place])   . Back pain   . Bell's palsy 2010  . Cancer ([place])    basal cell on the nose  . Edema    feet and legs  . Gallbladder disease   . HTN (hypertension)   . Hyperlipidemia   . Joint pain   . Loss of smell   . Loss of taste   . Palpitations   . Prediabetes   . Rectoceles   . SOB (shortness of breath)   . Swallowing difficulty   . Varicose vein of leg     Past Surgical History:  Procedure Laterality Date  . ANKLE SURGERY     left ankle  . AV FISTULA PLACEMENT     leg  . CHOLECYSTECTOMY    . GASTRIC BYPASS    . tummy tuck    . VEIN REPAIR      There were no vitals filed for this visit.  Subjective Assessment - 09/30/17 1131    Subjective  Reports having a spot of discomfort in L low back and down LLE.    Pertinent History  HTN, AFib    Limitations  Lifting;Walking;House hold activities    Diagnostic tests  x-ray; CT scan    Patient Stated Goals  get moving better    Currently in Pain?  Yes    Pain Score  -- No pain scale provided    Pain Location  Back    Pain Orientation  Left;Lower    Pain Descriptors / Indicators  Discomfort    Pain Type  Chronic pain    Pain Onset  More than a month ago          [facility] PT Assessment - 09/30/17 0001      Assessment   Medical Diagnosis  lumbar pain with radiation down left leg    Next MD Visit  August 2019    Prior Therapy  no      Precautions   Precautions  None      Restrictions   Weight Bearing Restrictions  No                   [facility] Adult PT Treatment/Exercise - 09/30/17 0001      Lumbar Exercises: Aerobic   Nustep  L4 x10 min      Lumbar Exercises: Standing   Row  Strengthening;Both;20 reps Pink XTS    Shoulder Extension  Strengthening;Both;20 reps Pink XTS      Lumbar Exercises: Supine   Clam  20 reps;3 seconds red theraband    Bent Knee Raise  20  reps    Bridge  20 reps;3 seconds    Straight Leg Raise  20 reps      Modalities   Modalities  --               PT Short Term Goals - 09/22/17 2125      PT SHORT TERM GOAL #1   Title  STG=LTG        PT Long Term Goals - 09/22/17 2125      PT LONG TERM GOAL #1   Title  Patient will be independent with HEP    Time  6    Period  Weeks    Status  New      PT LONG TERM GOAL #2   Title  Patient will improve bilateral LE strength to 4+/5 or greater to improve stability during gait and functional activities.    Time  6    Period  Weeks    Status  New      PT LONG TERM GOAL #3   Title  Patient will report no neurological symptoms down left LE to indicate no nerve irritation.    Time  6    Period  Weeks    Status  New      PT LONG TERM GOAL #4   Title  Patient will demonstrate proper lifting body mechanics to protect back while lifting/carrying grandchild.    Time  6    Period  Weeks    Status  New      PT LONG TERM GOAL #5   Title  Patient will report ability to perform ADLs and home activities with less than or equal to 3/10 pain.    Time  6    Period  Weeks    Status  New            Plan - 09/30/17 1225    Clinical Impression Statement  Patient tolerated today's treatment well as she arrived with discomfort in L low back  and LLE weakness. Patient able to demonstrate good core activation during exercises. Greater LLE weakness reported by patient due to previous injury, history of varicose veins as well as the discomfort. Postural education provided with standing exercises along with core activation VCs. Patient denied modalities secondary to no increased pain with therex. Patient encouraged to continue HEP at home.    Rehab Potential  Good    PT Frequency  2x / week    PT Duration  6 weeks    PT Treatment/Interventions  ADLs/Self Care Home Management;Cryotherapy;Electrical Stimulation;Traction;Moist Heat;Iontophoresis 4mg /ml Dexamethasone;Neuromuscular re-education;Therapeutic exercise;Balance training;Taping;Dry needling;Manual techniques;Passive range of motion;Ultrasound;Therapeutic activities;Patient/family education    PT Next Visit Plan  nustep, core stabilization, hip strengthening, modalities PRN for pain relief    PT Home Exercise Plan  see patient education section    Consulted and Agree with Plan of Care  Patient       Patient will benefit from skilled therapeutic intervention in order to improve the following deficits and impairments:  Pain, Decreased activity tolerance, Decreased endurance, Decreased range of motion, Decreased strength, Difficulty walking, Postural dysfunction  Visit Diagnosis: Chronic bilateral low back pain with left-sided sciatica  Muscle weakness (generalized)     Problem List Patient Active Problem List   Diagnosis Date Noted  . Special screening for malignant neoplasms, colon 09/06/2017  . Chronic anticoagulation 09/06/2017  . Prediabetes 01/03/2017  . Class 1 obesity with serious comorbidity and body mass index (BMI) of 31.0 to 31.9 in adult  01/03/2017  . Intrinsic eczema 07/14/2016  . Cellulitis of lower extremity 07/14/2016  . Class 1 obesity with serious comorbidity and body mass index (BMI) of 33.0 to 33.9 in adult 07/12/2016  . Essential hypertension 05/20/2016   . Depression 05/20/2016  . Class 2 obesity without serious comorbidity with body mass index (BMI) of 38.0 to 38.9 in adult 05/20/2016  . Class 2 obesity with serious comorbidity and body mass index (BMI) of 39.0 to 39.9 in adult 04/21/2016  . Shortness of breath on exertion 04/21/2016  . Fatigue 04/21/2016  . Vitamin D deficiency 04/21/2016  . Status post gastric bypass for obesity 04/21/2016    Standley Brooking, PTA 09/30/2017, 12:31 PM  [facility] [place], [place], [address] Phone: [phone]   Fax:  [phone]  Name: Othella Slappey MRN: [id number] Date of Birth: 1945/09/23

## 2017-10-14 ENCOUNTER — Ambulatory Visit (HOSPITAL_COMMUNITY)
Admission: RE | Admit: 2017-10-14 | Discharge: 2017-10-14 | Disposition: A | Discharge status: Home / Self Care | Payer: Medicare Other | Source: Ambulatory Visit | Attending: Internal Medicine | Admitting: Internal Medicine

## 2017-10-14 DIAGNOSIS — Z9884 Bariatric surgery status: Secondary | ICD-10-CM | POA: Diagnosis not present

## 2017-10-14 DIAGNOSIS — R109 Unspecified abdominal pain: Secondary | ICD-10-CM

## 2017-10-14 DIAGNOSIS — Z9049 Acquired absence of other specified parts of digestive tract: Secondary | ICD-10-CM | POA: Diagnosis not present

## 2017-10-14 DIAGNOSIS — R932 Abnormal findings on diagnostic imaging of liver and biliary tract: Secondary | ICD-10-CM | POA: Insufficient documentation

## 2017-10-14 DIAGNOSIS — K639 Disease of intestine, unspecified: Secondary | ICD-10-CM | POA: Diagnosis not present

## 2017-10-20 ENCOUNTER — Encounter: Payer: Self-pay | Admitting: Physician Assistant

## 2017-10-20 ENCOUNTER — Ambulatory Visit (INDEPENDENT_AMBULATORY_CARE_PROVIDER_SITE_OTHER): Payer: Medicare Other | Admitting: Physician Assistant

## 2017-10-20 VITALS — BP 160/92 | HR 55 | Temp 97.9°F | Ht 63.0 in | Wt 187.0 lb

## 2017-10-20 DIAGNOSIS — I482 Chronic atrial fibrillation, unspecified: Secondary | ICD-10-CM | POA: Insufficient documentation

## 2017-10-20 DIAGNOSIS — I10 Essential (primary) hypertension: Secondary | ICD-10-CM | POA: Diagnosis not present

## 2017-10-20 DIAGNOSIS — R635 Abnormal weight gain: Secondary | ICD-10-CM

## 2017-10-20 MED ORDER — HYDROCHLOROTHIAZIDE 12.5 MG PO TABS: 12.5000 mg | ORAL_TABLET | ORAL | 3 refills | Status: DC | PRN

## 2017-10-20 NOTE — Patient Instructions (Signed)
Breakfast: eggs 2-3 Or greek yogurt low fat DANNON 1 slice delightful [employer] bread  Lunch: 2 slice [employer] delightfully bread       Or Nature's Own Light 4 ounces chicken, turkey, roast beef 1 slice Thin sliced cheese Sargento Mustard ok 1 piece of fruit  Supper: 6 ounces lean meat 2 cups raw/cooked veg or 1 cup pintos, corn lima  3 snacks 100 calories or less  

## 2017-10-21 ENCOUNTER — Other Ambulatory Visit: Payer: Self-pay | Admitting: *Deleted

## 2017-10-21 ENCOUNTER — Encounter: Payer: Self-pay | Admitting: Physical Therapy

## 2017-10-21 ENCOUNTER — Ambulatory Visit: Payer: Medicare Other | Admitting: Physical Therapy

## 2017-10-21 DIAGNOSIS — M6281 Muscle weakness (generalized): Secondary | ICD-10-CM

## 2017-10-21 DIAGNOSIS — G8929 Other chronic pain: Secondary | ICD-10-CM | POA: Diagnosis not present

## 2017-10-21 DIAGNOSIS — M5442 Lumbago with sciatica, left side: Principal | ICD-10-CM

## 2017-10-21 MED ORDER — HYDROCHLOROTHIAZIDE 12.5 MG PO TABS: 12.5000 mg | ORAL_TABLET | Freq: Every day | ORAL | 3 refills | Status: DC | PRN

## 2017-10-21 NOTE — Therapy (Signed)
[facility] [place], [place], [address] Phone: [phone]   Fax:  [phone]  Physical Therapy Treatment  Patient Details  Name: Jaclyn Lowe MRN: [id number] Date of Birth: 01-21-1946 Referring Provider: Particia Nearing   Encounter Date: 10/21/2017  PT End of Session - 10/21/17 0912    Visit Number  4    Number of Visits  12    Date for PT Re-Evaluation  12/21/17    Authorization Type  progress note every 10th visit, KX modifier at 15th visit    PT Start Time  0908   late arrival   PT Stop Time  0950    PT Time Calculation (min)  42 min    Activity Tolerance  Patient tolerated treatment well    Behavior During Therapy  [facility] for tasks assessed/performed       Past Medical History:  Diagnosis Date  . A-fib ([place])   . Back pain   . Bell's palsy 2010  . Cancer ([place])    basal cell on the nose  . Edema    feet and legs  . Gallbladder disease   . HTN (hypertension)   . Hyperlipidemia   . Joint pain   . Loss of smell   . Loss of taste   . Palpitations   . Prediabetes   . Rectoceles   . SOB (shortness of breath)   . Swallowing difficulty   . Varicose vein of leg     Past Surgical History:  Procedure Laterality Date  . ANKLE SURGERY     left ankle  . AV FISTULA PLACEMENT     leg  . CHOLECYSTECTOMY    . GASTRIC BYPASS    . tummy tuck    . VEIN REPAIR      There were no vitals filed for this visit.  Subjective Assessment - 10/21/17 0913    Subjective  Patient reports having to babysit grandchildren over the last few weeks. She reports standing still pain is not bad but when she would lift her 45 lb and 20 lb grand children it would increase her pain.    Pertinent History  HTN, AFib    Limitations  Lifting;Walking;House hold activities    Diagnostic tests  x-ray; CT scan    Patient Stated Goals  get moving better    Currently in Pain?  Yes    Pain Score  --   "Mild"   Pain Location  Back    Pain  Orientation  Lower    Pain Descriptors / Indicators  Discomfort;Sore    Pain Type  Chronic pain    Pain Onset  More than a month ago    Pain Frequency  Intermittent         [facility] PT Assessment - 10/21/17 0001      Assessment   Medical Diagnosis  lumbar pain with radiation down left leg    Next MD Visit  August 2019    Prior Therapy  no                   [facility] Adult PT Treatment/Exercise - 10/21/17 0001      Therapeutic Activites    Therapeutic Activities  Lifting    Lifting  assessed lifting 14# box from stool observed with bending at the waist and no knee flexion. patient education for Designer, television/film set with HEP. Continues to demonstrate intermittent spinal flexion despite cuing.      Exercises   Exercises  Lumbar      Lumbar Exercises: Aerobic   Nustep  L4 x10 min      Lumbar Exercises: Standing   Row  Strengthening;Both;20 reps    Row Limitations  Pink XTS    Shoulder Extension  Strengthening;Both;20 reps    Shoulder Extension Limitations  Pink XTS      Lumbar Exercises: Supine   Clam  20 reps;3 seconds    Bridge  20 reps;3 seconds    Straight Leg Raise  20 reps      Lumbar Exercises: Sidelying   Hip Abduction  Both;20 reps   2x10     Modalities   Modalities  --               PT Short Term Goals - 09/22/17 2125      PT SHORT TERM GOAL #1   Title  STG=LTG        PT Long Term Goals - 10/21/17 0936      PT LONG TERM GOAL #1   Title  Patient will be independent with HEP    Time  6    Period  Weeks    Status  On-going   new HEP provided today 10/21/17     PT LONG TERM GOAL #2   Title  Patient will improve bilateral LE strength to 4+/5 or greater to improve stability during gait and functional activities.    Time  6    Period  Weeks    Status  On-going   NT 10/21/17     PT LONG TERM GOAL #3   Title  Patient will report no neurological symptoms down left LE to indicate no nerve irritation.    Time  6    Period  Weeks     Status  On-going   no symptoms down LE but remains at low back     PT LONG TERM GOAL #4   Title  Patient will demonstrate proper lifting body mechanics to protect back while lifting/carrying grandchild.    Time  6    Period  Weeks    Status  On-going   demos spinal flexion and no hip hinge     PT LONG TERM GOAL #5   Title  Patient will report ability to perform ADLs and home activities with less than or equal to 3/10 pain.    Time  6    Period  Weeks    Status  Achieved   "no pain but weak"           Plan - 10/21/17 0957    Clinical Impression Statement  Patient was able to tolerate treatment well with no reports of increased pain. Patient required verbal and tactile cuing for proper form and technique for sidelying hip abduction. Patient's body mechanics assessed for lifting. Patient demonstrateed spinal flexion as well as bending at low back vs hip hinge while lifting 14# box from stool, Patient educated with demonstration, verbal and tactile cues of proper body technique. Patient was able to demonstrate improved mechanics but intermittently demonstrates spinal flexion with lifting. Patient provided with educational handout for body mechanics to which patient reported understanding.     Clinical Presentation  Evolving    Clinical Decision Making  Low    Rehab Potential  Good    PT Frequency  2x / week    PT Duration  6 weeks    PT Treatment/Interventions  ADLs/Self Care Home Management;Cryotherapy;Electrical Stimulation;Traction;Moist Heat;Iontophoresis 4mg /ml Dexamethasone;Neuromuscular re-education;Therapeutic exercise;Balance training;Taping;Dry needling;Manual  techniques;Passive range of motion;Ultrasound;Therapeutic activities;Patient/family education    PT Next Visit Plan  Assess body mechanics with 14# box, STW/M to L low back, nustep, core stabilization, hip strengthening, modalities PRN for pain relief    Consulted and Agree with Plan of Care  Patient       Patient  will benefit from skilled therapeutic intervention in order to improve the following deficits and impairments:  Pain, Decreased activity tolerance, Decreased endurance, Decreased range of motion, Decreased strength, Difficulty walking, Postural dysfunction  Visit Diagnosis: Chronic bilateral low back pain with left-sided sciatica  Muscle weakness (generalized)     Problem List Patient Active Problem List   Diagnosis Date Noted  . Chronic atrial fibrillation ([address]) 10/20/2017  . Special screening for malignant neoplasms, colon 09/06/2017  . Chronic anticoagulation 09/06/2017  . Prediabetes 01/03/2017  . Class 1 obesity with serious comorbidity and body mass index (BMI) of 31.0 to 31.9 in adult 01/03/2017  . Intrinsic eczema 07/14/2016  . Cellulitis of lower extremity 07/14/2016  . Class 1 obesity with serious comorbidity and body mass index (BMI) of 33.0 to 33.9 in adult 07/12/2016  . Essential hypertension 05/20/2016  . Depression 05/20/2016  . Class 2 obesity without serious comorbidity with body mass index (BMI) of 38.0 to 38.9 in adult 05/20/2016  . Class 2 obesity with serious comorbidity and body mass index (BMI) of 39.0 to 39.9 in adult 04/21/2016  . Shortness of breath on exertion 04/21/2016  . Fatigue 04/21/2016  . Vitamin D deficiency 04/21/2016  . Status post gastric bypass for obesity 04/21/2016   Gabriela Eves, PT, DPT 10/21/2017, 10:04 AM  [facility] [place], [place], [address] Phone: [phone]   Fax:  [phone]  Name: Jaclyn Lowe MRN: [id number] Date of Birth: [birth year]

## 2017-10-25 NOTE — Progress Notes (Signed)
BP (!) 160/92   Pulse (!) 55   Temp 97.9 F (36.6 C) (Oral)   Ht '5\' 3"'$  (1.6 m)   Wt 187 lb (84.8 kg)   BMI 33.13 kg/m    Subjective:    Patient ID: Jaclyn Lowe, female    DOB: [birth year], 72 y.o.   MRN: [id number]  HPI: Jaclyn Lowe is a 72 y.o. female presenting on 10/20/2017 for Hypertension  This patient comes in for a one-month follow-up on her blood pressure.  We have made some medication changes.  She states that she is not tolerating the Coreg very well.  It caused her pulse to drop dramatically into the 40s and low 50s.  Therefore she stopped it.  Her blood pressure readings are just slightly above normal.  But she does have a very low uncontrolled pulse she does have known chronic atrial fibrillation.  And she is concerned about having gained a few pounds.  She had worked through the [employer] and wellness program through [employer] group.  She does have Topamax at home and states that she tolerated it very well so I encouraged her to restart that medicine.  I have also given her the 1500-calorie diet outline.  Past Medical History:  Diagnosis Date  . A-fib ([place])   . Back pain   . Bell's palsy 2010  . Cancer ([place])    basal cell on the nose  . Edema    feet and legs  . Gallbladder disease   . HTN (hypertension)   . Hyperlipidemia   . Joint pain   . Loss of smell   . Loss of taste   . Palpitations   . Prediabetes   . Rectoceles   . SOB (shortness of breath)   . Swallowing difficulty   . Varicose vein of leg    Relevant past medical, surgical, family and social history reviewed and updated as indicated. Interim medical history since our last visit reviewed. Allergies and medications reviewed and updated. DATA REVIEWED: CHART IN EPIC  Family History reviewed for pertinent findings.  Review of Systems  Constitutional: Negative.   HENT: Negative.   Eyes: Negative.   Respiratory: Negative.   Cardiovascular: Negative.   Gastrointestinal: Negative.    Genitourinary: Negative.     Allergies as of 10/20/2017      Reactions   Neosporin [neomycin-bacitracin Zn-polymyx] Itching, Rash      Medication List        Accurate as of 10/20/17 11:59 PM. Always use your most recent med list.          atorvastatin 10 MG tablet Commonly known as:  LIPITOR Take 10 mg by mouth daily.   cyclobenzaprine 10 MG tablet Commonly known as:  FLEXERIL Take 1 tablet (10 mg total) by mouth 3 (three) times daily as needed for muscle spasms.   ELIQUIS 5 MG Tabs tablet Generic drug:  apixaban Take 5 mg by mouth 2 (two) times daily.   hydrochlorothiazide 12.5 MG tablet Commonly known as:  HYDRODIURIL Take 1 tablet (12.5 mg total) by mouth as needed.   lisinopril 20 MG tablet Commonly known as:  PRINIVIL,ZESTRIL Take 1 tablet (20 mg total) by mouth daily.   triamcinolone cream 0.5 % Commonly known as:  KENALOG Apply 1 application topically 3 (three) times daily.   Vitamin D (Ergocalciferol) 50000 units Caps capsule Commonly known as:  DRISDOL Take 1 capsule (50,000 Units total) by mouth every 7 (seven) days.  Objective:    BP (!) 160/92   Pulse (!) 55   Temp 97.9 F (36.6 C) (Oral)   Ht '5\' 3"'$  (1.6 m)   Wt 187 lb (84.8 kg)   BMI 33.13 kg/m   Allergies  Allergen Reactions  . Neosporin [Neomycin-Bacitracin Zn-Polymyx] Itching and Rash    Wt Readings from Last 3 Encounters:  10/20/17 187 lb (84.8 kg)  09/27/17 188 lb (85.3 kg)  09/06/17 188 lb (85.3 kg)    Physical Exam  Constitutional: She is oriented to person, place, and time. She appears well-developed and well-nourished.  HENT:  Head: Normocephalic and atraumatic.  Eyes: Pupils are equal, round, and reactive to light. Conjunctivae and EOM are normal.  Cardiovascular: Normal rate, regular rhythm, normal heart sounds and intact distal pulses.  Pulmonary/Chest: Effort normal and breath sounds normal.  Abdominal: Soft. Bowel sounds are normal.  Neurological: She is  alert and oriented to person, place, and time. She has normal reflexes.  Skin: Skin is warm and dry. No rash noted.  Psychiatric: She has a normal mood and affect. Her behavior is normal. Judgment and thought content normal.    Results for orders placed or performed in visit on 07/12/17  [facility]  Result Value Ref Range   Glucose 94 65 - 99 mg/dL   BUN 12 8 - 27 mg/dL   Creatinine, Ser 0.86 0.57 - 1.00 mg/dL   GFR calc non Af Amer 68 >59 mL/min/1.73   GFR calc Af Amer 79 >59 mL/min/1.73   BUN/Creatinine Ratio 14 12 - 28   Sodium 142 134 - 144 mmol/L   Potassium 4.1 3.5 - 5.2 mmol/L   Chloride 106 96 - 106 mmol/L   CO2 23 20 - 29 mmol/L   Calcium 8.8 8.7 - 10.3 mg/dL  Microalbumin / creatinine urine ratio  Result Value Ref Range   Creatinine, Urine 91.0 Not Estab. mg/dL   Microalbumin, Urine 64.1 Not Estab. ug/mL   Microalb/Creat Ratio 70.4 (H) 0.0 - 30.0 mg/g creat      Assessment & Plan:   1. Essential hypertension Continue lisinopril  2. Chronic atrial fibrillation (HCC) Continue Eliquis  3. Weight gain Restart Topamax Diet measures and recheck in 2 weeks.   Continue all other maintenance medications as listed above.  Follow up plan: Return in about 2 weeks (around 11/03/2017).  Educational handout given for Briar PA-C [place] [address]  [place], [place] [address] [phone]   10/25/2017, 8:59 AM

## 2017-10-28 ENCOUNTER — Ambulatory Visit: Payer: Medicare Other | Admitting: Physical Therapy

## 2017-10-28 ENCOUNTER — Encounter: Payer: Self-pay | Admitting: Physical Therapy

## 2017-10-28 DIAGNOSIS — M6281 Muscle weakness (generalized): Secondary | ICD-10-CM | POA: Diagnosis not present

## 2017-10-28 DIAGNOSIS — G8929 Other chronic pain: Secondary | ICD-10-CM

## 2017-10-28 DIAGNOSIS — M5442 Lumbago with sciatica, left side: Secondary | ICD-10-CM | POA: Diagnosis not present

## 2017-10-28 NOTE — Therapy (Signed)
[facility] [place], [place], [address] Phone: [phone]   Fax:  [phone]  Physical Therapy Treatment  Patient Details  Name: Jaclyn Lowe MRN: [id number] Date of Birth: 02-07-1946 Referring Provider: Particia Nearing   Encounter Date: 10/28/2017  PT End of Session - 10/28/17 0919    Visit Number  5    Number of Visits  12    Date for PT Re-Evaluation  12/21/17    Authorization Type  progress note every 10th visit, KX modifier at 15th visit    PT Start Time  0904    PT Stop Time  0948    PT Time Calculation (min)  44 min    Activity Tolerance  Patient tolerated treatment well    Behavior During Therapy  [facility] for tasks assessed/performed       Past Medical History:  Diagnosis Date  . A-fib ([place])   . Back pain   . Bell's palsy 2010  . Cancer ([place])    basal cell on the nose  . Edema    feet and legs  . Gallbladder disease   . HTN (hypertension)   . Hyperlipidemia   . Joint pain   . Loss of smell   . Loss of taste   . Palpitations   . Prediabetes   . Rectoceles   . SOB (shortness of breath)   . Swallowing difficulty   . Varicose vein of leg     Past Surgical History:  Procedure Laterality Date  . ANKLE SURGERY     left ankle  . AV FISTULA PLACEMENT     leg  . CHOLECYSTECTOMY    . GASTRIC BYPASS    . tummy tuck    . VEIN REPAIR      There were no vitals filed for this visit.  Subjective Assessment - 10/28/17 0918    Subjective  Reports that she has been having some cramping in L quad since moving a 9x12 rug.    Pertinent History  HTN, AFib    Limitations  Lifting;Walking;House hold activities    Diagnostic tests  x-ray; CT scan    Patient Stated Goals  get moving better    Currently in Pain?  No/denies         [name] PT Assessment - 10/28/17 0001      Assessment   Medical Diagnosis  lumbar pain with radiation down left leg    Next MD Visit  August 2019    Prior Therapy  no      Precautions   Precautions  None      Restrictions   Weight Bearing Restrictions  No                   [facility] Adult PT Treatment/Exercise - 10/28/17 0001      Exercises   Exercises  Lumbar      Lumbar Exercises: Aerobic   Nustep  L5 x15 min      Lumbar Exercises: Standing   Row  Strengthening;Both;20 reps    Row Limitations  Pink XTS    Shoulder Extension  Strengthening;Both;20 reps    Shoulder Extension Limitations  Pink XTS      Lumbar Exercises: Supine   Ab Set  20 reps;5 seconds    Clam  20 reps;3 seconds    Clam Limitations  red theraband    Bent Knee Raise  20 reps    Bridge  20 reps;3 seconds    Straight Leg  Raise  15 reps               PT Short Term Goals - 09/22/17 2125      PT SHORT TERM GOAL #1   Title  STG=LTG        PT Long Term Goals - 10/21/17 0936      PT LONG TERM GOAL #1   Title  Patient will be independent with HEP    Time  6    Period  Weeks    Status  On-going   new HEP provided today 10/21/17     PT LONG TERM GOAL #2   Title  Patient will improve bilateral LE strength to 4+/5 or greater to improve stability during gait and functional activities.    Time  6    Period  Weeks    Status  On-going   NT 10/21/17     PT LONG TERM GOAL #3   Title  Patient will report no neurological symptoms down left LE to indicate no nerve irritation.    Time  6    Period  Weeks    Status  On-going   no symptoms down LE but remains at low back     PT LONG TERM GOAL #4   Title  Patient will demonstrate proper lifting body mechanics to protect back while lifting/carrying grandchild.    Time  6    Period  Weeks    Status  On-going   demos spinal flexion and no hip hinge     PT LONG TERM GOAL #5   Title  Patient will report ability to perform ADLs and home activities with less than or equal to 3/10 pain.    Time  6    Period  Weeks    Status  Achieved   "no pain but weak"           Plan - 10/28/17 1025    Clinical  Impression Statement  Patient tolerated today's treatment well with no reports of pain upon arrival. Patient did report feeling of cramping in L quad upon arrival since moving a rug and also during SLRs. Patient educated regarding proper core activation technique which was also encouraged via VCs throughout therex. Patient experienced low back soreness during bridge activities but able to complete in slow timing. Patient denied pain upon end of treatment.    Rehab Potential  Good    PT Frequency  2x / week    PT Duration  6 weeks    PT Treatment/Interventions  ADLs/Self Care Home Management;Cryotherapy;Electrical Stimulation;Traction;Moist Heat;Iontophoresis 4mg /ml Dexamethasone;Neuromuscular re-education;Therapeutic exercise;Balance training;Taping;Dry needling;Manual techniques;Passive range of motion;Ultrasound;Therapeutic activities;Patient/family education    PT Next Visit Plan  Focus on postural strengthening next treatment.    PT Home Exercise Plan  see patient education section    Consulted and Agree with Plan of Care  Patient       Patient will benefit from skilled therapeutic intervention in order to improve the following deficits and impairments:  Pain, Decreased activity tolerance, Decreased endurance, Decreased range of motion, Decreased strength, Difficulty walking, Postural dysfunction  Visit Diagnosis: Chronic bilateral low back pain with left-sided sciatica  Muscle weakness (generalized)     Problem List Patient Active Problem List   Diagnosis Date Noted  . Chronic atrial fibrillation ([place]) 10/20/2017  . Special screening for malignant neoplasms, colon 09/06/2017  . Chronic anticoagulation 09/06/2017  . Prediabetes 01/03/2017  . Class 1 obesity with serious comorbidity and body mass index (BMI) of  31.0 to 31.9 in adult 01/03/2017  . Intrinsic eczema 07/14/2016  . Cellulitis of lower extremity 07/14/2016  . Class 1 obesity with serious comorbidity and body mass index  (BMI) of 33.0 to 33.9 in adult 07/12/2016  . Essential hypertension 05/20/2016  . Depression 05/20/2016  . Class 2 obesity without serious comorbidity with body mass index (BMI) of 38.0 to 38.9 in adult 05/20/2016  . Class 2 obesity with serious comorbidity and body mass index (BMI) of 39.0 to 39.9 in adult 04/21/2016  . Shortness of breath on exertion 04/21/2016  . Fatigue 04/21/2016  . Vitamin D deficiency 04/21/2016  . Status post gastric bypass for obesity 04/21/2016    Standley Brooking, PTA 10/28/2017, 10:29 AM  [facility] [place], [place], [address] Phone: [phone]   Fax:  [phone]  Name: Manila Rommel MRN: [id number] Date of Birth: 1945/10/14

## 2017-11-04 ENCOUNTER — Ambulatory Visit: Payer: Medicare Other | Attending: Physician Assistant | Admitting: Physical Therapy

## 2017-11-04 ENCOUNTER — Encounter: Payer: Self-pay | Admitting: Physical Therapy

## 2017-11-04 DIAGNOSIS — M5442 Lumbago with sciatica, left side: Secondary | ICD-10-CM | POA: Insufficient documentation

## 2017-11-04 DIAGNOSIS — G8929 Other chronic pain: Secondary | ICD-10-CM

## 2017-11-04 DIAGNOSIS — M6281 Muscle weakness (generalized): Secondary | ICD-10-CM | POA: Diagnosis not present

## 2017-11-04 NOTE — Therapy (Signed)
[facility] [place], [place], [address] Phone: [phone]   Fax:  [phone]  Physical Therapy Treatment  Patient Details  Name: Jaclyn Lowe MRN: [id number] Date of Birth: November 21, 1945 Referring Provider: Particia Nearing   Encounter Date: 11/04/2017  PT End of Session - 11/04/17 1035    Visit Number  6    Number of Visits  12    Date for PT Re-Evaluation  12/21/17    Authorization Type  progress note every 10th visit, KX modifier at 15th visit    PT Start Time  0900    PT Stop Time  0955    PT Time Calculation (min)  55 min    Activity Tolerance  Patient tolerated treatment well    Behavior During Therapy  [facility] for tasks assessed/performed       Past Medical History:  Diagnosis Date  . A-fib ([place])   . Back pain   . Bell's palsy 2010  . Cancer ([place])    basal cell on the nose  . Edema    feet and legs  . Gallbladder disease   . HTN (hypertension)   . Hyperlipidemia   . Joint pain   . Loss of smell   . Loss of taste   . Palpitations   . Prediabetes   . Rectoceles   . SOB (shortness of breath)   . Swallowing difficulty   . Varicose vein of leg     Past Surgical History:  Procedure Laterality Date  . ANKLE SURGERY     left ankle  . AV FISTULA PLACEMENT     leg  . CHOLECYSTECTOMY    . GASTRIC BYPASS    . tummy tuck    . VEIN REPAIR      There were no vitals filed for this visit.  Subjective Assessment - 11/04/17 0906    Subjective  Pt reporting pain in to left thigh due to being very active yesterday and on her feet.     Pertinent History  HTN, AFib    Limitations  Lifting;Walking;House hold activities    Diagnostic tests  x-ray; CT scan    Patient Stated Goals  get moving better    Currently in Pain?  Yes    Pain Score  3     Pain Orientation  Lower    Pain Descriptors / Indicators  Aching    Pain Onset  More than a month ago         [facility] PT Assessment - 11/04/17 0001      Assessment    Medical Diagnosis  lumbar pain with radiation down left leg    Next MD Visit  August 2019    Prior Therapy  no      Precautions   Precautions  None      Restrictions   Weight Bearing Restrictions  No                   [facility] Adult PT Treatment/Exercise - 11/04/17 0001      Exercises   Exercises  Lumbar      Lumbar Exercises: Aerobic   Nustep  L5 x15 min      Lumbar Exercises: Standing   Row  Strengthening;Both;20 reps    Row Limitations  Pink STS    Shoulder Extension  Strengthening    Shoulder Extension Limitations  Pink XTS      Lumbar Exercises: Supine   Ab Set  20 reps;5 seconds  Clam  20 reps;3 seconds    Clam Limitations  red theraband    Bent Knee Raise  20 reps    Bridge  20 reps;3 seconds    Straight Leg Raise  15 reps             PT Education - 11/04/17 1035    Education Details  HEP, added exercises    Person(s) Educated  Patient    Methods  Explanation;Demonstration;Handout    Comprehension  Verbalized understanding;Returned demonstration       PT Short Term Goals - 09/22/17 2125      PT SHORT TERM GOAL #1   Title  STG=LTG        PT Long Term Goals - 11/04/17 1207      PT LONG TERM GOAL #1   Title  Patient will be independent with HEP    Time  6    Period  Weeks    Status  On-going      PT LONG TERM GOAL #2   Title  Patient will improve bilateral LE strength to 4+/5 or greater to improve stability during gait and functional activities.    Period  Weeks    Status  On-going      PT LONG TERM GOAL #3   Title  Patient will report no neurological symptoms down left LE to indicate no nerve irritation.    Time  6    Period  Weeks    Status  On-going      PT LONG TERM GOAL #4   Title  Patient will demonstrate proper lifting body mechanics to protect back while lifting/carrying grandchild.    Time  6    Period  Weeks    Status  On-going      PT LONG TERM GOAL #5   Title  Patient will report ability to perform ADLs and  home activities with less than or equal to 3/10 pain.    Time  6    Period  Weeks    Status  Achieved            Plan - 11/04/17 1205    Clinical Impression Statement  Pt tolerating treatment well. Reporting less pain at end of session of 3/10 in her Low back and left thigh. Pt was issued a Home Exercise handout with piriformis, and postural stretching. continue skilled PT to progress toward pt's PLOF.     Rehab Potential  Good    PT Frequency  2x / week    PT Duration  6 weeks    PT Treatment/Interventions  ADLs/Self Care Home Management;Cryotherapy;Electrical Stimulation;Traction;Moist Heat;Iontophoresis 4mg /ml Dexamethasone;Neuromuscular re-education;Therapeutic exercise;Balance training;Taping;Dry needling;Manual techniques;Passive range of motion;Ultrasound;Therapeutic activities;Patient/family education    PT Next Visit Plan  Focus on postural strengthening next treatment.    PT Home Exercise Plan  see patient education section    Consulted and Agree with Plan of Care  Patient       Patient will benefit from skilled therapeutic intervention in order to improve the following deficits and impairments:  Pain, Decreased activity tolerance, Decreased endurance, Decreased range of motion, Decreased strength, Difficulty walking, Postural dysfunction  Visit Diagnosis: Chronic bilateral low back pain with left-sided sciatica  Muscle weakness (generalized)     Problem List Patient Active Problem List   Diagnosis Date Noted  . Chronic atrial fibrillation ([place]) 10/20/2017  . Special screening for malignant neoplasms, colon 09/06/2017  . Chronic anticoagulation 09/06/2017  . Prediabetes 01/03/2017  . Class 1 obesity  with serious comorbidity and body mass index (BMI) of 31.0 to 31.9 in adult 01/03/2017  . Intrinsic eczema 07/14/2016  . Cellulitis of lower extremity 07/14/2016  . Class 1 obesity with serious comorbidity and body mass index (BMI) of 33.0 to 33.9 in adult 07/12/2016   . Essential hypertension 05/20/2016  . Depression 05/20/2016  . Class 2 obesity without serious comorbidity with body mass index (BMI) of 38.0 to 38.9 in adult 05/20/2016  . Class 2 obesity with serious comorbidity and body mass index (BMI) of 39.0 to 39.9 in adult 04/21/2016  . Shortness of breath on exertion 04/21/2016  . Fatigue 04/21/2016  . Vitamin D deficiency 04/21/2016  . Status post gastric bypass for obesity 04/21/2016    Oretha Caprice, MPT 11/04/2017, 12:09 PM  [facility] [address] [place], [place], [address] Phone: [phone]   Fax:  [phone]  Name: Jaclyn Lowe MRN: [id number] Date of Birth: 10/28/1945

## 2017-11-10 ENCOUNTER — Ambulatory Visit (INDEPENDENT_AMBULATORY_CARE_PROVIDER_SITE_OTHER): Payer: Medicare Other | Admitting: Physician Assistant

## 2017-11-10 ENCOUNTER — Telehealth: Payer: Self-pay | Admitting: Physician Assistant

## 2017-11-10 ENCOUNTER — Encounter: Payer: Self-pay | Admitting: Physician Assistant

## 2017-11-10 VITALS — BP 145/71 | HR 65 | Temp 97.2°F | Ht 63.0 in | Wt 180.2 lb

## 2017-11-10 DIAGNOSIS — I10 Essential (primary) hypertension: Secondary | ICD-10-CM

## 2017-11-10 DIAGNOSIS — R635 Abnormal weight gain: Secondary | ICD-10-CM | POA: Diagnosis not present

## 2017-11-11 ENCOUNTER — Ambulatory Visit: Payer: Medicare Other | Admitting: Physical Therapy

## 2017-11-11 ENCOUNTER — Encounter: Payer: Self-pay | Admitting: Physical Therapy

## 2017-11-11 DIAGNOSIS — M5442 Lumbago with sciatica, left side: Secondary | ICD-10-CM | POA: Diagnosis not present

## 2017-11-11 DIAGNOSIS — M6281 Muscle weakness (generalized): Secondary | ICD-10-CM | POA: Diagnosis not present

## 2017-11-11 DIAGNOSIS — G8929 Other chronic pain: Secondary | ICD-10-CM | POA: Diagnosis not present

## 2017-11-11 NOTE — Therapy (Signed)
[facility] [place], [place], [address] Phone: [phone]   Fax:  [phone]  Physical Therapy Treatment  Patient Details  Name: Jaclyn Lowe MRN: [id number] Date of Birth: 12/08/1945 Referring Provider: Particia Nearing   Encounter Date: 11/11/2017    Past Medical History:  Diagnosis Date  . A-fib ([place])   . Back pain   . Bell's palsy 2010  . Cancer ([place])    basal cell on the nose  . Edema    feet and legs  . Gallbladder disease   . HTN (hypertension)   . Hyperlipidemia   . Joint pain   . Loss of smell   . Loss of taste   . Palpitations   . Prediabetes   . Rectoceles   . SOB (shortness of breath)   . Swallowing difficulty   . Varicose vein of leg     Past Surgical History:  Procedure Laterality Date  . ANKLE SURGERY     left ankle  . AV FISTULA PLACEMENT     leg  . CHOLECYSTECTOMY    . GASTRIC BYPASS    . tummy tuck    . VEIN REPAIR      There were no vitals filed for this visit.  Subjective Assessment - 11/11/17 0919    Subjective  Pt still reporting pain in left hip/thigh today. Pt reporting feeling "pretty good" after last therapy session. Pt reporting she has been doing her HEP and was having a trouble with bridging.     Limitations  Lifting;House hold activities    Diagnostic tests  x-ray; CT scan    Patient Stated Goals  get moving better    Currently in Pain?  Yes    Pain Score  2     Pain Location  Back    Pain Orientation  Lower    Pain Descriptors / Indicators  Aching    Pain Type  Chronic pain    Pain Onset  More than a month ago    Aggravating Factors   bending, lifting, I have to sleep on left side in fetal position and I wake up a couple of times to go to bath room    Pain Relieving Factors  rest, ice, aleve    Multiple Pain Sites  Yes    Pain Score  3    Pain Location  Leg    Pain Orientation  Left    Pain Descriptors / Indicators  Aching    Pain Type  Chronic pain    Pain Onset   More than a month ago    Aggravating Factors   standing long periods                               PT Education - 11/11/17 0938    Education Details  exercise technique with bridges and open book stretches for spine/back    Person(s) Educated  Patient    Methods  Explanation;Demonstration       PT Short Term Goals - 09/22/17 2125      PT SHORT TERM GOAL #1   Title  STG=LTG        PT Long Term Goals - 11/04/17 1207      PT LONG TERM GOAL #1   Title  Patient will be independent with HEP    Time  6    Period  Weeks    Status  On-going  PT LONG TERM GOAL #2   Title  Patient will improve bilateral LE strength to 4+/5 or greater to improve stability during gait and functional activities.    Period  Weeks    Status  On-going      PT LONG TERM GOAL #3   Title  Patient will report no neurological symptoms down left LE to indicate no nerve irritation.    Time  6    Period  Weeks    Status  On-going      PT LONG TERM GOAL #4   Title  Patient will demonstrate proper lifting body mechanics to protect back while lifting/carrying grandchild.    Time  6    Period  Weeks    Status  On-going      PT LONG TERM GOAL #5   Title  Patient will report ability to perform ADLs and home activities with less than or equal to 3/10 pain.    Time  6    Period  Weeks    Status  Achieved            Plan - 11/11/17 0940    Clinical Impression Statement  Pt tolerating treatment well reporting 2/10 pain at end of session in her left hip. Pt to continue to progress to with postural exercises and core strenghtening/stretching.     Rehab Potential  Good    PT Frequency  2x / week    PT Treatment/Interventions  ADLs/Self Care Home Management;Cryotherapy;Electrical Stimulation;Traction;Moist Heat;Iontophoresis 4mg /ml Dexamethasone;Neuromuscular re-education;Therapeutic exercise;Balance training;Taping;Dry needling;Manual techniques;Passive range of  motion;Ultrasound;Therapeutic activities;Patient/family education    PT Next Visit Plan  Focus on postural strengthening next treatment.    PT Home Exercise Plan  see patient education section    Consulted and Agree with Plan of Care  Patient       Patient will benefit from skilled therapeutic intervention in order to improve the following deficits and impairments:  Pain, Decreased activity tolerance, Decreased endurance, Decreased range of motion, Decreased strength, Difficulty walking, Postural dysfunction  Visit Diagnosis: Chronic bilateral low back pain with left-sided sciatica  Muscle weakness (generalized)     Problem List Patient Active Problem List   Diagnosis Date Noted  . Chronic atrial fibrillation ([place]) 10/20/2017  . Special screening for malignant neoplasms, colon 09/06/2017  . Chronic anticoagulation 09/06/2017  . Prediabetes 01/03/2017  . Class 1 obesity with serious comorbidity and body mass index (BMI) of 31.0 to 31.9 in adult 01/03/2017  . Intrinsic eczema 07/14/2016  . Cellulitis of lower extremity 07/14/2016  . Class 1 obesity with serious comorbidity and body mass index (BMI) of 33.0 to 33.9 in adult 07/12/2016  . Essential hypertension 05/20/2016  . Depression 05/20/2016  . Class 2 obesity without serious comorbidity with body mass index (BMI) of 38.0 to 38.9 in adult 05/20/2016  . Class 2 obesity with serious comorbidity and body mass index (BMI) of 39.0 to 39.9 in adult 04/21/2016  . Shortness of breath on exertion 04/21/2016  . Fatigue 04/21/2016  . Vitamin D deficiency 04/21/2016  . Status post gastric bypass for obesity 04/21/2016    Oretha Caprice, MPT 11/11/2017, 10:00 AM  [facility] [place], [place], [address] Phone: [phone]   Fax:  [phone]  Name: Alexyia Secord MRN: [id number] Date of Birth: December 31, 1945

## 2017-11-14 NOTE — Progress Notes (Signed)
BP (!) 145/71   Pulse 65   Temp (!) 97.2 F (36.2 C) (Oral)   Ht '5\' 3"'$  (1.6 m)   Wt 180 lb 3.2 oz (81.7 kg)   BMI 31.92 kg/m    Subjective:    Patient ID: Jaclyn Lowe, female    DOB: 1945-09-18, 72 y.o.   MRN: [id number]  HPI: Jaclyn Lowe is a 72 y.o. female presenting on 11/10/2017 for Hypertension (2 week )  This patient comes in for 2-week follow-up on her blood pressure, diet and exercise efforts.  We had considered restarting Topamax to work on her appetite.  She states that she is trying to do without it and has been able to lose about 7 pounds over the past couple of weeks.  She states she is done very well with following her guidelines and trying to be more active.  We are going to have her continue to do this and plan to see her back in a few more weeks.  Past Medical History:  Diagnosis Date  . A-fib ([place])   . Back pain   . Bell's palsy 2010  . Cancer ([place])    basal cell on the nose  . Edema    feet and legs  . Gallbladder disease   . HTN (hypertension)   . Hyperlipidemia   . Joint pain   . Loss of smell   . Loss of taste   . Palpitations   . Prediabetes   . Rectoceles   . SOB (shortness of breath)   . Swallowing difficulty   . Varicose vein of leg    Relevant past medical, surgical, family and social history reviewed and updated as indicated. Interim medical history since our last visit reviewed. Allergies and medications reviewed and updated. DATA REVIEWED: CHART IN EPIC  Family History reviewed for pertinent findings.  Review of Systems  Constitutional: Negative.   HENT: Negative.   Eyes: Negative.   Respiratory: Negative.   Gastrointestinal: Negative.   Genitourinary: Negative.     Allergies as of 11/10/2017      Reactions   Neosporin [neomycin-bacitracin Zn-polymyx] Itching, Rash      Medication List        Accurate as of 11/10/17 11:59 PM. Always use your most recent med list.          atorvastatin 20 MG tablet Commonly known as:   LIPITOR TAKE 1 2 TABLET BY MOUTH ONCE DAILY   cyclobenzaprine 10 MG tablet Commonly known as:  FLEXERIL Take 1 tablet (10 mg total) by mouth 3 (three) times daily as needed for muscle spasms.   ELIQUIS 5 MG Tabs tablet Generic drug:  apixaban Take 5 mg by mouth 2 (two) times daily.   hydrochlorothiazide 12.5 MG tablet Commonly known as:  HYDRODIURIL Take 1 tablet (12.5 mg total) by mouth daily as needed.   lisinopril 20 MG tablet Commonly known as:  PRINIVIL,ZESTRIL Take 1 tablet (20 mg total) by mouth daily.   triamcinolone cream 0.5 % Commonly known as:  KENALOG Apply 1 application topically 3 (three) times daily.   Vitamin D (Ergocalciferol) 50000 units Caps capsule Commonly known as:  DRISDOL Take 1 capsule (50,000 Units total) by mouth every 7 (seven) days.          Objective:    BP (!) 145/71   Pulse 65   Temp (!) 97.2 F (36.2 C) (Oral)   Ht '5\' 3"'$  (1.6 m)   Wt 180 lb 3.2 oz (81.7  kg)   BMI 31.92 kg/m   Allergies  Allergen Reactions  . Neosporin [Neomycin-Bacitracin Zn-Polymyx] Itching and Rash    Wt Readings from Last 3 Encounters:  11/10/17 180 lb 3.2 oz (81.7 kg)  10/20/17 187 lb (84.8 kg)  09/27/17 188 lb (85.3 kg)    Physical Exam  Constitutional: She is oriented to person, place, and time. She appears well-developed and well-nourished.  HENT:  Head: Normocephalic and atraumatic.  Eyes: Pupils are equal, round, and reactive to light. Conjunctivae and EOM are normal.  Cardiovascular: Normal rate, regular rhythm, normal heart sounds and intact distal pulses.  Pulmonary/Chest: Effort normal and breath sounds normal.  Abdominal: Soft. Bowel sounds are normal.  Neurological: She is alert and oriented to person, place, and time. She has normal reflexes.  Skin: Skin is warm and dry. No rash noted.  Psychiatric: She has a normal mood and affect. Her behavior is normal. Judgment and thought content normal.    Results for orders placed or performed in  visit on 07/12/17  [facility]  Result Value Ref Range   Glucose 94 65 - 99 mg/dL   BUN 12 8 - 27 mg/dL   Creatinine, Ser 0.86 0.57 - 1.00 mg/dL   GFR calc non Af Amer 68 >59 mL/min/1.73   GFR calc Af Amer 79 >59 mL/min/1.73   BUN/Creatinine Ratio 14 12 - 28   Sodium 142 134 - 144 mmol/L   Potassium 4.1 3.5 - 5.2 mmol/L   Chloride 106 96 - 106 mmol/L   CO2 23 20 - 29 mmol/L   Calcium 8.8 8.7 - 10.3 mg/dL  Microalbumin / creatinine urine ratio  Result Value Ref Range   Creatinine, Urine 91.0 Not Estab. mg/dL   Microalbumin, Urine 64.1 Not Estab. ug/mL   Microalb/Creat Ratio 70.4 (H) 0.0 - 30.0 mg/g creat      Assessment & Plan:   1. Essential hypertension Continue medication, diet and exercise  2. Weight gain Continue medication, diet and exercise   Continue all other maintenance medications as listed above.  Follow up plan: Return in about 2 weeks (around 11/24/2017) for recheck.  Educational handout given for [place] PA-C [place] [address]  [place], [place] [address] [phone]   11/14/2017, 1:31 PM

## 2017-11-18 ENCOUNTER — Encounter: Payer: Medicare Other | Admitting: Physical Therapy

## 2017-11-22 ENCOUNTER — Encounter: Payer: Self-pay | Admitting: Physician Assistant

## 2017-11-22 ENCOUNTER — Ambulatory Visit (INDEPENDENT_AMBULATORY_CARE_PROVIDER_SITE_OTHER): Payer: Medicare Other | Admitting: Physician Assistant

## 2017-11-22 VITALS — BP 135/64 | HR 69 | Temp 97.5°F | Ht 63.0 in | Wt 186.0 lb

## 2017-11-22 DIAGNOSIS — E669 Obesity, unspecified: Secondary | ICD-10-CM | POA: Diagnosis not present

## 2017-11-22 DIAGNOSIS — Z6831 Body mass index (BMI) 31.0-31.9, adult: Secondary | ICD-10-CM | POA: Diagnosis not present

## 2017-11-22 NOTE — Telephone Encounter (Signed)
Patient is coming today to be seen.

## 2017-11-24 NOTE — Progress Notes (Signed)
BP 135/64   Pulse 69   Temp (!) 97.5 F (36.4 C) (Oral)   Ht '5\' 3"'$  (1.6 m)   Wt 186 lb (84.4 kg)   BMI 32.95 kg/m    Subjective:    Patient ID: Jaclyn Lowe, female    DOB: 06-17-1945, 72 y.o.   MRN: [id number]  HPI: Jaclyn Lowe is a 72 y.o. female presenting on 11/22/2017 for Hypertension (2 week follow up) This patient comes in for a 2-week recheck on her blood pressure and weight loss efforts.  She has not done very well with her food choices she really wants to work hard on this again.  She is given continue with her diet and exercise.  She is going on a trip for a couple of weeks.  She knows that she will be walking a lot during that time.  Her blood pressure is very well controlled today.  I think we have gotten a good combination on her medication.   Past Medical History:  Diagnosis Date  . A-fib ([place])   . Back pain   . Bell's palsy 2010  . Cancer ([place])    basal cell on the nose  . Edema    feet and legs  . Gallbladder disease   . HTN (hypertension)   . Hyperlipidemia   . Joint pain   . Loss of smell   . Loss of taste   . Palpitations   . Prediabetes   . Rectoceles   . SOB (shortness of breath)   . Swallowing difficulty   . Varicose vein of leg    Relevant past medical, surgical, family and social history reviewed and updated as indicated. Interim medical history since our last visit reviewed. Allergies and medications reviewed and updated. DATA REVIEWED: CHART IN EPIC  Family History reviewed for pertinent findings.  Review of Systems  Constitutional: Negative.   HENT: Negative.   Eyes: Negative.   Respiratory: Negative.   Gastrointestinal: Negative.   Genitourinary: Negative.     Allergies as of 11/22/2017      Reactions   Neosporin [neomycin-bacitracin Zn-polymyx] Itching, Rash      Medication List        Accurate as of 11/22/17 11:59 PM. Always use your most recent med list.          atorvastatin 20 MG tablet Commonly known as:   LIPITOR TAKE 1 2 TABLET BY MOUTH ONCE DAILY   cyclobenzaprine 10 MG tablet Commonly known as:  FLEXERIL Take 1 tablet (10 mg total) by mouth 3 (three) times daily as needed for muscle spasms.   ELIQUIS 5 MG Tabs tablet Generic drug:  apixaban Take 5 mg by mouth 2 (two) times daily.   hydrochlorothiazide 12.5 MG tablet Commonly known as:  HYDRODIURIL Take 1 tablet (12.5 mg total) by mouth daily as needed.   lisinopril 20 MG tablet Commonly known as:  PRINIVIL,ZESTRIL Take 1 tablet (20 mg total) by mouth daily.   triamcinolone cream 0.5 % Commonly known as:  KENALOG Apply 1 application topically 3 (three) times daily.   Vitamin D (Ergocalciferol) 50000 units Caps capsule Commonly known as:  DRISDOL Take 1 capsule (50,000 Units total) by mouth every 7 (seven) days.          Objective:    BP 135/64   Pulse 69   Temp (!) 97.5 F (36.4 C) (Oral)   Ht '5\' 3"'$  (1.6 m)   Wt 186 lb (84.4 kg)   BMI 32.95  kg/m   Allergies  Allergen Reactions  . Neosporin [Neomycin-Bacitracin Zn-Polymyx] Itching and Rash    Wt Readings from Last 3 Encounters:  11/22/17 186 lb (84.4 kg)  11/10/17 180 lb 3.2 oz (81.7 kg)  10/20/17 187 lb (84.8 kg)    Physical Exam  Constitutional: She is oriented to person, place, and time. She appears well-developed and well-nourished.  HENT:  Head: Normocephalic and atraumatic.  Eyes: Pupils are equal, round, and reactive to light. Conjunctivae and EOM are normal.  Cardiovascular: Normal rate, regular rhythm, normal heart sounds and intact distal pulses.  Pulmonary/Chest: Effort normal and breath sounds normal.  Abdominal: Soft. Bowel sounds are normal.  Neurological: She is alert and oriented to person, place, and time. She has normal reflexes.  Skin: Skin is warm and dry. No rash noted.  Psychiatric: She has a normal mood and affect. Her behavior is normal. Judgment and thought content normal.    Results for orders placed or performed in visit on  07/12/17  [facility]  Result Value Ref Range   Glucose 94 65 - 99 mg/dL   BUN 12 8 - 27 mg/dL   Creatinine, Ser 0.86 0.57 - 1.00 mg/dL   GFR calc non Af Amer 68 >59 mL/min/1.73   GFR calc Af Amer 79 >59 mL/min/1.73   BUN/Creatinine Ratio 14 12 - 28   Sodium 142 134 - 144 mmol/L   Potassium 4.1 3.5 - 5.2 mmol/L   Chloride 106 96 - 106 mmol/L   CO2 23 20 - 29 mmol/L   Calcium 8.8 8.7 - 10.3 mg/dL  Microalbumin / creatinine urine ratio  Result Value Ref Range   Creatinine, Urine 91.0 Not Estab. mg/dL   Microalbumin, Urine 64.1 Not Estab. ug/mL   Microalb/Creat Ratio 70.4 (H) 0.0 - 30.0 mg/g creat      Assessment & Plan:   1. Class 1 obesity with serious comorbidity and body mass index (BMI) of 31.0 to 31.9 in adult, unspecified obesity type Continue diet and exercise recheck in 2 to 4 weeks   Continue all other maintenance medications as listed above.  Follow up plan: Return in about 4 weeks (around 12/20/2017) for recheck.  Educational handout given for [place] PA-C [place] [address]  [place], [place] [address] [phone]   11/24/2017, 8:00 AM

## 2017-11-25 ENCOUNTER — Encounter: Payer: Medicare Other | Admitting: Physical Therapy

## 2017-12-08 ENCOUNTER — Ambulatory Visit (INDEPENDENT_AMBULATORY_CARE_PROVIDER_SITE_OTHER): Payer: Medicare Other | Admitting: Family

## 2017-12-08 ENCOUNTER — Encounter: Payer: Self-pay | Admitting: Family

## 2017-12-08 VITALS — BP 183/77 | HR 77 | Temp 98.5°F | Ht 63.0 in | Wt 187.0 lb

## 2017-12-08 DIAGNOSIS — B9689 Other specified bacterial agents as the cause of diseases classified elsewhere: Secondary | ICD-10-CM

## 2017-12-08 DIAGNOSIS — J208 Acute bronchitis due to other specified organisms: Secondary | ICD-10-CM | POA: Diagnosis not present

## 2017-12-08 MED ORDER — PREDNISONE 10 MG (21) PO TBPK: ORAL_TABLET | ORAL | 0 refills | Status: DC

## 2017-12-08 MED ORDER — AZITHROMYCIN 250 MG PO TABS: ORAL_TABLET | ORAL | 0 refills | Status: DC

## 2017-12-08 NOTE — Progress Notes (Signed)
   Subjective:    Patient ID: Jaclyn Lowe, female    DOB: [birth year], 72 y.o.   MRN: [id number]  Chief Complaint  Patient presents with  . Cough  . yellow sputum  . Chest Pain    Cough  This is a new problem. The current episode started in the past 7 days. The problem has been gradually worsening. The problem occurs every few minutes. The cough is productive of sputum. Associated symptoms include chills, nasal congestion, postnasal drip, rhinorrhea, shortness of breath and wheezing. Pertinent negatives include no ear congestion, ear pain, fever, headaches or myalgias. The symptoms are aggravated by lying down. She has tried OTC cough suppressant for the symptoms. The treatment provided mild relief.      Review of Systems  Constitutional: Positive for chills. Negative for fever.  HENT: Positive for postnasal drip and rhinorrhea. Negative for ear pain.   Respiratory: Positive for cough, shortness of breath and wheezing.   Musculoskeletal: Negative for myalgias.  Neurological: Negative for headaches.  All other systems reviewed and are negative.      Objective:   Physical Exam  Constitutional: She is oriented to person, place, and time. She appears well-developed and well-nourished. No distress.  HENT:  Head: Normocephalic and atraumatic.  Right Ear: External ear normal.  Nose: Mucosal edema and rhinorrhea present.  Mouth/Throat: Posterior oropharyngeal erythema present.  Eyes: Pupils are equal, round, and reactive to light.  Neck: Normal range of motion. Neck supple. No thyromegaly present.  Cardiovascular: Normal rate, regular rhythm, normal heart sounds and intact distal pulses.  No murmur heard. Pulmonary/Chest: Effort normal. No respiratory distress. She has wheezes.  Constant productive cough   Abdominal: Soft. Bowel sounds are normal. She exhibits no distension. There is no tenderness.  Musculoskeletal: Normal range of motion. She exhibits no edema or tenderness.    Neurological: She is alert and oriented to person, place, and time. She has normal reflexes. No cranial nerve deficit.  Skin: Skin is warm and dry.  Psychiatric: She has a normal mood and affect. Her behavior is normal. Judgment and thought content normal.  Vitals reviewed.     BP (!) 183/77   Pulse 77   Temp 98.5 F (36.9 C) (Oral)   Ht 5\' 3"  (1.6 m)   Wt 187 lb (84.8 kg)   BMI 33.13 kg/m      Assessment & Plan:  Jaclyn Lowe comes in today with chief complaint of Cough; yellow sputum; and Chest Pain   Diagnosis and orders addressed:  1. Acute bacterial bronchitis - Take meds as prescribed - Use a cool mist humidifier  -Use saline nose sprays frequently -Force fluids -For any cough or congestion  Use plain Mucinex- regular strength or max strength is fine -For fever or aces or pains- take tylenol or ibuprofen. -Throat lozenges if help -RTO if symptoms worsen or do not improve - azithromycin (ZITHROMAX) 250 MG tablet; Take 500 mg once, then 250 mg for four days  Dispense: 6 tablet; Refill: 0 - predniSONE (STERAPRED UNI-PAK 21 TAB) 10 MG (21) TBPK tablet; Use as directed  Dispense: 21 tablet; Refill: 0   Evelina Dun, FNP

## 2017-12-08 NOTE — Patient Instructions (Signed)

## 2017-12-09 ENCOUNTER — Encounter: Payer: Medicare Other | Admitting: Physical Therapy

## 2017-12-16 ENCOUNTER — Encounter: Payer: Self-pay | Admitting: Physical Therapy

## 2017-12-16 ENCOUNTER — Ambulatory Visit: Payer: Medicare Other | Attending: Physician Assistant | Admitting: Physical Therapy

## 2017-12-16 DIAGNOSIS — G8929 Other chronic pain: Secondary | ICD-10-CM | POA: Diagnosis not present

## 2017-12-16 DIAGNOSIS — M5442 Lumbago with sciatica, left side: Secondary | ICD-10-CM | POA: Insufficient documentation

## 2017-12-16 DIAGNOSIS — M6281 Muscle weakness (generalized): Secondary | ICD-10-CM

## 2017-12-16 NOTE — Therapy (Signed)
[facility] [place], [place], [address] Phone: [phone]   Fax:  [phone]  Physical Therapy Treatment  Patient Details  Name: Jaclyn Lowe MRN: [id number] Date of Birth: [birth year] Referring Provider (PT): Particia Nearing   Encounter Date: 12/16/2017  PT End of Session - 12/16/17 1036    Visit Number  7    Number of Visits  12    Date for PT Re-Evaluation  12/21/17    Authorization Type  progress note every 10th visit, KX modifier at 15th visit    PT Start Time  0906   2 units secondary to late arrival   PT Stop Time  0945    PT Time Calculation (min)  39 min    Activity Tolerance  Patient tolerated treatment well    Behavior During Therapy  [facility] for tasks assessed/performed       Past Medical History:  Diagnosis Date  . A-fib ([place])   . Back pain   . Bell's palsy 2010  . Cancer ([place])    basal cell on the nose  . Edema    feet and legs  . Gallbladder disease   . HTN (hypertension)   . Hyperlipidemia   . Joint pain   . Loss of smell   . Loss of taste   . Palpitations   . Prediabetes   . Rectoceles   . SOB (shortness of breath)   . Swallowing difficulty   . Varicose vein of leg     Past Surgical History:  Procedure Laterality Date  . ANKLE SURGERY     left ankle  . AV FISTULA PLACEMENT     leg  . CHOLECYSTECTOMY    . GASTRIC BYPASS    . tummy tuck    . VEIN REPAIR      There were no vitals filed for this visit.  Subjective Assessment - 12/16/17 0925    Subjective  Reports that she was out of the country for a trip and came back and was real sick so she hasn't been back to PT.    Pertinent History  HTN, AFib    Limitations  Lifting;House hold activities    Diagnostic tests  x-ray; CT scan    Patient Stated Goals  get moving better    Currently in Pain?  Other (Comment)   No pain assessment provided        [facility] PT Assessment - 12/16/17 0001      Assessment   Medical Diagnosis  lumbar  pain with radiation down left leg    Next MD Visit  August 2019    Prior Therapy  no      Precautions   Precautions  None      Restrictions   Weight Bearing Restrictions  No                   [facility] Adult PT Treatment/Exercise - 12/16/17 0001      Exercises   Exercises  Lumbar      Lumbar Exercises: Aerobic   Nustep  L6 x18 min      Lumbar Exercises: Standing   Row  Strengthening;Both;20 reps    Row Limitations  Pink XTS    Shoulder Extension  Strengthening;Both;20 reps;Limitations    Shoulder Extension Limitations  Pink XTS    Other Standing Lumbar Exercises  Snow angel x20 reps, B D2 AROM x15 reps each   in standing at wall for postural cues   Other  Standing Lumbar Exercises  Wall pushup x15 reps      Lumbar Exercises: Supine   Bridge  20 reps               PT Short Term Goals - 09/22/17 2125      PT SHORT TERM GOAL #1   Title  STG=LTG        PT Long Term Goals - 11/11/17 1227      PT LONG TERM GOAL #1   Time  6    Period  Weeks    Status  On-going      PT LONG TERM GOAL #2   Title  Patient will improve bilateral LE strength to 4+/5 or greater to improve stability during gait and functional activities.    Time  6    Period  Weeks    Status  On-going      PT LONG TERM GOAL #3   Title  Patient will report no neurological symptoms down left LE to indicate no nerve irritation.    Time  6    Period  Weeks    Status  On-going      PT LONG TERM GOAL #4   Title  Patient will demonstrate proper lifting body mechanics to protect back while lifting/carrying grandchild.    Time  6    Period  Weeks    Status  On-going      PT LONG TERM GOAL #5   Title  Patient will report ability to perform ADLs and home activities with less than or equal to 3/10 pain.    Time  6    Period  Weeks    Status  Achieved            Plan - 12/16/17 1135    Clinical Impression Statement  Patient tolerated today's treatment fairly well although a less  strenuous treatment attempted as patient recovering from illness. Patient observed with kyphotic posture in standing against the wall and patient is cognizant of the issue. More postural strengthening completed although patient somewhat limited with BUE activity. Patient did not report any increased postural pain or LBP during treatment.    Rehab Potential  Good    PT Frequency  2x / week    PT Duration  6 weeks    PT Treatment/Interventions  ADLs/Self Care Home Management;Cryotherapy;Electrical Stimulation;Traction;Moist Heat;Iontophoresis 4mg /ml Dexamethasone;Neuromuscular re-education;Therapeutic exercise;Balance training;Taping;Dry needling;Manual techniques;Passive range of motion;Ultrasound;Therapeutic activities;Patient/family education    PT Next Visit Plan  Focus on postural strengthening next treatment.    PT Home Exercise Plan  see patient education section    Consulted and Agree with Plan of Care  Patient       Patient will benefit from skilled therapeutic intervention in order to improve the following deficits and impairments:  Pain, Decreased activity tolerance, Decreased endurance, Decreased range of motion, Decreased strength, Difficulty walking, Postural dysfunction  Visit Diagnosis: Chronic bilateral low back pain with left-sided sciatica  Muscle weakness (generalized)     Problem List Patient Active Problem List   Diagnosis Date Noted  . Chronic atrial fibrillation 10/20/2017  . Special screening for malignant neoplasms, colon 09/06/2017  . Chronic anticoagulation 09/06/2017  . Prediabetes 01/03/2017  . Class 1 obesity with serious comorbidity and body mass index (BMI) of 31.0 to 31.9 in adult 01/03/2017  . Intrinsic eczema 07/14/2016  . Cellulitis of lower extremity 07/14/2016  . Essential hypertension 05/20/2016  . Depression 05/20/2016  . Shortness of breath on exertion 04/21/2016  .  Fatigue 04/21/2016  . Vitamin D deficiency 04/21/2016  . Status post gastric  bypass for obesity 04/21/2016    Standley Brooking, PTA 12/16/2017, 11:43 AM  [facility] [place], [place], [address] Phone: [phone]   Fax:  [phone]  Name: Jaclyn Lowe MRN: [id number] Date of Birth: 12-Dec-1945

## 2017-12-23 ENCOUNTER — Encounter: Payer: Self-pay | Admitting: Physical Therapy

## 2017-12-23 ENCOUNTER — Ambulatory Visit: Payer: Medicare Other | Admitting: Physical Therapy

## 2017-12-23 DIAGNOSIS — Z23 Encounter for immunization: Secondary | ICD-10-CM | POA: Diagnosis not present

## 2017-12-23 DIAGNOSIS — M6281 Muscle weakness (generalized): Secondary | ICD-10-CM

## 2017-12-23 DIAGNOSIS — G8929 Other chronic pain: Secondary | ICD-10-CM | POA: Diagnosis not present

## 2017-12-23 DIAGNOSIS — M5442 Lumbago with sciatica, left side: Secondary | ICD-10-CM | POA: Diagnosis not present

## 2017-12-23 NOTE — Therapy (Signed)
[facility] [place], [place], [address] Phone: [phone]   Fax:  [phone]  Physical Therapy Treatment  Patient Details  Name: Jaclyn Lowe MRN: [id number] Date of Birth: [birth year] Referring Provider (PT): Particia Nearing   Encounter Date: 12/23/2017  PT End of Session - 12/23/17 1128    Visit Number  8    Number of Visits  12    Date for PT Re-Evaluation  12/21/17    Authorization Type  progress note every 10th visit, KX modifier at 15th visit    PT Start Time  0903    PT Stop Time  0954    PT Time Calculation (min)  51 min    Activity Tolerance  Patient tolerated treatment well    Behavior During Therapy  [facility] for tasks assessed/performed       Past Medical History:  Diagnosis Date  . A-fib ([place])   . Back pain   . Bell's palsy 2010  . Cancer ([place])    basal cell on the nose  . Edema    feet and legs  . Gallbladder disease   . HTN (hypertension)   . Hyperlipidemia   . Joint pain   . Loss of smell   . Loss of taste   . Palpitations   . Prediabetes   . Rectoceles   . SOB (shortness of breath)   . Swallowing difficulty   . Varicose vein of leg     Past Surgical History:  Procedure Laterality Date  . ANKLE SURGERY     left ankle  . AV FISTULA PLACEMENT     leg  . CHOLECYSTECTOMY    . GASTRIC BYPASS    . tummy tuck    . VEIN REPAIR      There were no vitals filed for this visit.  Subjective Assessment - 12/23/17 1202    Subjective  The exercise is helping.    Currently in Pain?  Yes    Pain Score  2     Pain Location  Back    Pain Orientation  Lower    Pain Descriptors / Indicators  Aching    Pain Type  Chronic pain    Pain Onset  More than a month ago                       [facility] Adult PT Treatment/Exercise - 12/23/17 0001      Exercises   Exercises  Lumbar      Lumbar Exercises: Aerobic   Nustep  Level 5 x 20 minutes.      Lumbar Exercises: Standing   Other Standing  Lumbar Exercises  Standing UBE at 120 RPM's with draw-in x 6 minutes f/b Pink XTS with elbows low, elbows high and in full extension and then punches and all performed to fatigue.      Lumbar Exercises: Seated   Other Seated Lumbar Exercises  Lumbar extension and ab machine at 50# 4 minutes each direction (8 minutes total)      Other Seated Lumbar Exercises  S and DKTC stretch f/b instruct in corner stretch.               PT Short Term Goals - 09/22/17 2125      PT SHORT TERM GOAL #1   Title  STG=LTG        PT Long Term Goals - 11/11/17 1227      PT LONG TERM GOAL #1  Time  6    Period  Weeks    Status  On-going      PT LONG TERM GOAL #2   Title  Patient will improve bilateral LE strength to 4+/5 or greater to improve stability during gait and functional activities.    Time  6    Period  Weeks    Status  On-going      PT LONG TERM GOAL #3   Title  Patient will report no neurological symptoms down left LE to indicate no nerve irritation.    Time  6    Period  Weeks    Status  On-going      PT LONG TERM GOAL #4   Title  Patient will demonstrate proper lifting body mechanics to protect back while lifting/carrying grandchild.    Time  6    Period  Weeks    Status  On-going      PT LONG TERM GOAL #5   Title  Patient will report ability to perform ADLs and home activities with less than or equal to 3/10 pain.    Time  6    Period  Weeks    Status  Achieved            Plan - 12/23/17 1241    Clinical Impression Statement  Patient did great today.  No pain reports after treatment.  Educated patient on posture and provided her with a video she can watch pertaining to this.    PT Treatment/Interventions  ADLs/Self Care Home Management;Cryotherapy;Electrical Stimulation;Traction;Moist Heat;Iontophoresis 4mg /ml Dexamethasone;Neuromuscular re-education;Therapeutic exercise;Balance training;Taping;Dry needling;Manual techniques;Passive range of  motion;Ultrasound;Therapeutic activities;Patient/family education    PT Next Visit Plan  Focus on postural strengthening next treatment.    PT Home Exercise Plan  see patient education section    Consulted and Agree with Plan of Care  Patient       Patient will benefit from skilled therapeutic intervention in order to improve the following deficits and impairments:  Pain, Decreased activity tolerance, Decreased endurance, Decreased range of motion, Decreased strength, Difficulty walking, Postural dysfunction  Visit Diagnosis: Chronic bilateral low back pain with left-sided sciatica  Muscle weakness (generalized)     Problem List Patient Active Problem List   Diagnosis Date Noted  . Chronic atrial fibrillation 10/20/2017  . Special screening for malignant neoplasms, colon 09/06/2017  . Chronic anticoagulation 09/06/2017  . Prediabetes 01/03/2017  . Class 1 obesity with serious comorbidity and body mass index (BMI) of 31.0 to 31.9 in adult 01/03/2017  . Intrinsic eczema 07/14/2016  . Cellulitis of lower extremity 07/14/2016  . Essential hypertension 05/20/2016  . Depression 05/20/2016  . Shortness of breath on exertion 04/21/2016  . Fatigue 04/21/2016  . Vitamin D deficiency 04/21/2016  . Status post gastric bypass for obesity 04/21/2016    Taniah Reinecke, [place] MPT 12/23/2017, 12:47 PM  [facility] [address] [place], [place], [address] Phone: [phone]   Fax:  [phone]  Name: Jaclyn Lowe MRN: [id number] Date of Birth: 29-Jun-1945

## 2017-12-30 ENCOUNTER — Ambulatory Visit: Payer: Medicare Other | Attending: Physician Assistant | Admitting: Physical Therapy

## 2017-12-30 ENCOUNTER — Encounter: Payer: Self-pay | Admitting: Physical Therapy

## 2017-12-30 DIAGNOSIS — G8929 Other chronic pain: Secondary | ICD-10-CM | POA: Diagnosis not present

## 2017-12-30 DIAGNOSIS — M5442 Lumbago with sciatica, left side: Secondary | ICD-10-CM | POA: Insufficient documentation

## 2017-12-30 DIAGNOSIS — M6281 Muscle weakness (generalized): Secondary | ICD-10-CM | POA: Insufficient documentation

## 2017-12-30 NOTE — Therapy (Signed)
[facility] [place], [place], [address] Phone: [phone]   Fax:  [phone]  Physical Therapy Treatment/Discharge  Patient Details  Name: Jaclyn Lowe MRN: [id number] Date of Birth: 1945/08/24 Referring Provider (PT): Particia Nearing   Encounter Date: 12/30/2017  PT End of Session - 12/30/17 0904    Visit Number  9    Number of Visits  12    Date for PT Re-Evaluation  12/30/17    Authorization Type  progress note every 10th visit, KX modifier at 15th visit    PT Start Time  0903    PT Stop Time  0957    PT Time Calculation (min)  54 min    Activity Tolerance  Patient tolerated treatment well    Behavior During Therapy  [facility] for tasks assessed/performed       Past Medical History:  Diagnosis Date  . A-fib ([place])   . Back pain   . Bell's palsy 2010  . Cancer ([place])    basal cell on the nose  . Edema    feet and legs  . Gallbladder disease   . HTN (hypertension)   . Hyperlipidemia   . Joint pain   . Loss of smell   . Loss of taste   . Palpitations   . Prediabetes   . Rectoceles   . SOB (shortness of breath)   . Swallowing difficulty   . Varicose vein of leg     Past Surgical History:  Procedure Laterality Date  . ANKLE SURGERY     left ankle  . AV FISTULA PLACEMENT     leg  . CHOLECYSTECTOMY    . GASTRIC BYPASS    . tummy tuck    . VEIN REPAIR      There were no vitals filed for this visit.  Subjective Assessment - 12/30/17 0906    Subjective  Patient arrives states her low back pain has decreased but flares up again after picking up her grandchild.    Pertinent History  HTN, AFib    Limitations  Lifting;House hold activities    Diagnostic tests  x-ray; CT scan    Patient Stated Goals  get moving better    Currently in Pain?  Yes   did not provide pain scale        [facility] PT Assessment - 12/30/17 0001      Strength   Right Hip Flexion  4+/5    Left Hip Flexion  4+/5    Right Knee Flexion   4+/5    Right Knee Extension  4+/5    Left Knee Flexion  4+/5    Left Knee Extension  4+/5                   [facility] Adult PT Treatment/Exercise - 12/30/17 0001      Therapeutic Activites    Lifting   lifting 14# box from stool to waist level      Exercises   Exercises  Lumbar      Lumbar Exercises: Aerobic   Stationary Bike  Level 3 x15 mins    UBE (Upper Arm Bike)  120 RPM standing 6 min 3 fwd 3 bwd      Lumbar Exercises: Standing   Row  Strengthening;Both;20 reps    Row Limitations  Pink XTS    Shoulder Extension  Strengthening;Both;20 reps;Limitations    Shoulder Extension Limitations  Pink XTS    Other Standing Lumbar Exercises  Standing  UBE at 120 RPM's with draw-in x 6 minutes              PT Education - 12/30/17 1102    Education Details  rows, extensions, standing posture, chin tucks, seated abdominal crunch    Person(s) Educated  Patient    Methods  Explanation;Demonstration;Handout    Comprehension  Verbalized understanding;Returned demonstration       PT Short Term Goals - 09/22/17 2125      PT SHORT TERM GOAL #1   Title  STG=LTG        PT Long Term Goals - 12/30/17 0913      PT LONG TERM GOAL #1   Title  Patient will be independent with HEP    Time  6    Period  Weeks    Status  Achieved      PT LONG TERM GOAL #2   Title  Patient will improve bilateral LE strength to 4+/5 or greater to improve stability during gait and functional activities.    Time  6    Period  Weeks    Status  Achieved      PT LONG TERM GOAL #3   Title  Patient will report no neurological symptoms down left LE to indicate no nerve irritation.    Time  6    Period  Weeks    Status  Partially Met   intermittent nerve irritation     PT LONG TERM GOAL #4   Title  Patient will demonstrate proper lifting body mechanics to protect back while lifting/carrying grandchild.    Time  6    Period  Weeks    Status  Not Met   continues to flex at the spine with not  hip hinge     PT LONG TERM GOAL #5   Title  Patient will report ability to perform ADLs and home activities with less than or equal to 3/10 pain.    Time  6    Period  Weeks    Status  Achieved            Plan - 12/30/17 1053    Clinical Impression Statement  Patient was able to tolerate treatment well with no reports of pain during exercises. Patient and PT reviewed HEP and provided with red theraband. Patient and PT discussed finding time for HEP throughout busy day so she can maintain her health. Patient reported understanding. Patient discharged today with goals partially met.     Clinical Presentation  Evolving    Clinical Decision Making  Low    Rehab Potential  Good    PT Frequency  2x / week    PT Duration  6 weeks    PT Treatment/Interventions  ADLs/Self Care Home Management;Cryotherapy;Electrical Stimulation;Traction;Moist Heat;Iontophoresis 71m/ml Dexamethasone;Neuromuscular re-education;Therapeutic exercise;Balance training;Taping;Dry needling;Manual techniques;Passive range of motion;Ultrasound;Therapeutic activities;Patient/family education    PT Next Visit Plan  Focus on postural strengthening next treatment.    Consulted and Agree with Plan of Care  Patient       Patient will benefit from skilled therapeutic intervention in order to improve the following deficits and impairments:  Pain, Decreased activity tolerance, Decreased endurance, Decreased range of motion, Decreased strength, Difficulty walking, Postural dysfunction  Visit Diagnosis: Chronic bilateral low back pain with left-sided sciatica  Muscle weakness (generalized)     Problem List Patient Active Problem List   Diagnosis Date Noted  . Chronic atrial fibrillation 10/20/2017  . Special screening for malignant neoplasms, colon 09/06/2017  .  Chronic anticoagulation 09/06/2017  . Prediabetes 01/03/2017  . Class 1 obesity with serious comorbidity and body mass index (BMI) of 31.0 to 31.9 in adult  01/03/2017  . Intrinsic eczema 07/14/2016  . Cellulitis of lower extremity 07/14/2016  . Essential hypertension 05/20/2016  . Depression 05/20/2016  . Shortness of breath on exertion 04/21/2016  . Fatigue 04/21/2016  . Vitamin D deficiency 04/21/2016  . Status post gastric bypass for obesity 04/21/2016   PHYSICAL THERAPY DISCHARGE SUMMARY  Visits from Start of Care: 9  Current functional level related to goals / functional outcomes: See above   Remaining deficits: See goals   Education / Equipment: HEP Plan: Patient agrees to discharge.  Patient goals were partially met. Patient is being discharged due to being pleased with the current functional level.  ?????        Gabriela Eves, PT, DPT 12/30/2017, 11:03 AM  [facility] [address] [place], [place], [address] Phone: [phone]   Fax:  [phone]  Name: Abcde Oneil MRN: [id number] Date of Birth: 04/29/1945

## 2018-02-08 DIAGNOSIS — I4891 Unspecified atrial fibrillation: Secondary | ICD-10-CM | POA: Diagnosis not present

## 2018-02-08 DIAGNOSIS — R002 Palpitations: Secondary | ICD-10-CM | POA: Diagnosis not present

## 2018-02-08 DIAGNOSIS — R9431 Abnormal electrocardiogram [ECG] [EKG]: Secondary | ICD-10-CM | POA: Diagnosis not present

## 2018-02-23 ENCOUNTER — Ambulatory Visit (INDEPENDENT_AMBULATORY_CARE_PROVIDER_SITE_OTHER): Payer: Medicare Other | Admitting: Nurse Practitioner

## 2018-02-23 ENCOUNTER — Encounter: Payer: Self-pay | Admitting: Nurse Practitioner

## 2018-02-23 VITALS — BP 138/82 | HR 60 | Temp 98.3°F | Resp 22 | Ht 62.0 in

## 2018-02-23 DIAGNOSIS — J40 Bronchitis, not specified as acute or chronic: Secondary | ICD-10-CM

## 2018-02-23 MED ORDER — AZITHROMYCIN 250 MG PO TABS: ORAL_TABLET | ORAL | 0 refills | Status: DC

## 2018-02-23 MED ORDER — BENZONATATE 100 MG PO CAPS: 100.0000 mg | ORAL_CAPSULE | Freq: Three times a day (TID) | ORAL | 0 refills | Status: DC | PRN

## 2018-02-23 MED ORDER — METHYLPREDNISOLONE ACETATE 80 MG/ML IJ SUSP
80.0000 mg | Freq: Once | INTRAMUSCULAR | Status: AC
  Administered 2018-02-23: 80 mg via INTRAMUSCULAR

## 2018-02-23 NOTE — Patient Instructions (Signed)

## 2018-02-23 NOTE — Progress Notes (Signed)
Subjective:    Patient ID: Jaclyn Lowe, female    DOB: 03-21-1945, 72 y.o.   MRN: [id number]   Chief Complaint: Cough and Nasal Congestion   HPI Patient come sin c/o cough and congestion for over 2 weeks. She has been coughing up yellowish phlegm. Has had a fever off and on. She had an old albuterol inhaler that she pufed a couple of times. She also had some tessalon perles that helped some with cough.   Review of Systems  Constitutional: Negative for chills and fever.  HENT: Positive for congestion, rhinorrhea and sinus pain. Negative for ear pain, sore throat and trouble swallowing.   Respiratory: Positive for cough (productive). Negative for shortness of breath.   Cardiovascular: Negative.   Gastrointestinal: Negative.   Neurological: Negative.   Psychiatric/Behavioral: Negative.   All other systems reviewed and are negative.      Objective:   Physical Exam Vitals signs and nursing note reviewed.  Constitutional:      Appearance: Normal appearance. She is normal weight.  HENT:     Right Ear: Hearing, tympanic membrane, ear canal and external ear normal.     Left Ear: Hearing, tympanic membrane, ear canal and external ear normal.     Nose: Mucosal edema, congestion and rhinorrhea present.     Right Turbinates: Pale.     Right Sinus: No maxillary sinus tenderness or frontal sinus tenderness.     Left Sinus: No maxillary sinus tenderness or frontal sinus tenderness.     Mouth/Throat:     Lips: Pink.     Mouth: Mucous membranes are moist.     Pharynx: Oropharynx is clear.  Neck:     Musculoskeletal: Normal range of motion and neck supple.  Cardiovascular:     Rate and Rhythm: Normal rate and regular rhythm.  Pulmonary:     Effort: Pulmonary effort is normal.     Breath sounds: Normal breath sounds.  Lymphadenopathy:     Cervical: Cervical adenopathy (anterior cervical chain bil) present.  Skin:    General: Skin is warm.  Neurological:     General: No focal deficit  present.     Mental Status: She is alert.    BP 138/82   Pulse 60   Temp 98.3 F (36.8 C) (Oral)   Resp (!) 22   Ht 5\' 2"  (1.575 m)   SpO2 99%   BMI 34.20 kg/m       Assessment & Plan:  [name] in today with chief complaint of Cough and Nasal Congestion   1. Bronchitis 1. Take meds as prescribed 2. Use a cool mist humidifier especially during the winter months and when heat has been humid. 3. Use saline nose sprays frequently 4. Saline irrigations of the nose can be very helpful if done frequently.  * 4X daily for 1 week*  * Use of a nettie pot can be helpful with this. Follow directions with this* 5. Drink plenty of fluids 6. Keep thermostat turn down low 7.For any cough or congestion  Use plain Mucinex- regular strength or max strength is fine   * Children- consult with Pharmacist for dosing 8. For fever or aces or pains- take tylenol or ibuprofen appropriate for age and weight.  * for fevers greater than 101 orally you may alternate ibuprofen and tylenol every  3 hours.    - benzonatate (TESSALON PERLES) 100 MG capsule; Take 1 capsule (100 mg total) by mouth 3 (three) times daily as needed for cough.  Dispense: 20 capsule; Refill: 0 - methylPREDNISolone acetate (DEPO-MEDROL) injection 80 mg - azithromycin (ZITHROMAX Z-PAK) 250 MG tablet; As directed  Dispense: 6 tablet; Refill: 0  Rosetta-Margaret Hassell Done, FNP

## 2018-05-12 ENCOUNTER — Encounter: Payer: Self-pay | Admitting: Physician Assistant

## 2018-05-12 ENCOUNTER — Ambulatory Visit (INDEPENDENT_AMBULATORY_CARE_PROVIDER_SITE_OTHER): Payer: Medicare Other | Admitting: Physician Assistant

## 2018-05-12 ENCOUNTER — Other Ambulatory Visit: Payer: Self-pay

## 2018-05-12 VITALS — BP 112/62 | HR 52 | Temp 98.0°F | Ht 62.0 in | Wt 187.2 lb

## 2018-05-12 DIAGNOSIS — Z0000 Encounter for general adult medical examination without abnormal findings: Secondary | ICD-10-CM

## 2018-05-12 DIAGNOSIS — L2084 Intrinsic (allergic) eczema: Secondary | ICD-10-CM

## 2018-05-12 DIAGNOSIS — Z6831 Body mass index (BMI) 31.0-31.9, adult: Secondary | ICD-10-CM | POA: Diagnosis not present

## 2018-05-12 DIAGNOSIS — Z6833 Body mass index (BMI) 33.0-33.9, adult: Secondary | ICD-10-CM | POA: Diagnosis not present

## 2018-05-12 DIAGNOSIS — E669 Obesity, unspecified: Secondary | ICD-10-CM

## 2018-05-12 DIAGNOSIS — R635 Abnormal weight gain: Secondary | ICD-10-CM

## 2018-05-12 DIAGNOSIS — E559 Vitamin D deficiency, unspecified: Secondary | ICD-10-CM | POA: Diagnosis not present

## 2018-05-12 LAB — BAYER DCA HB A1C WAIVED: HB A1C: 5.7 % (ref <7.0)

## 2018-05-12 MED ORDER — SILVER SULFADIAZINE 1 % EX CREA: 1.0000 "application " | TOPICAL_CREAM | Freq: Two times a day (BID) | CUTANEOUS | 0 refills | Status: AC

## 2018-05-12 MED ORDER — TRIAMCINOLONE ACETONIDE 0.5 % EX CREA: 1.0000 "application " | TOPICAL_CREAM | Freq: Three times a day (TID) | CUTANEOUS | 6 refills | Status: DC

## 2018-05-12 MED ORDER — CEPHALEXIN 500 MG PO CAPS: 500.0000 mg | ORAL_CAPSULE | Freq: Four times a day (QID) | ORAL | 0 refills | Status: DC

## 2018-05-12 NOTE — Progress Notes (Signed)
Im i

## 2018-05-13 LAB — CMP14+EGFR
A/G RATIO: 1.8 (ref 1.2–2.2)
ALBUMIN: 4.2 g/dL (ref 3.7–4.7)
ALK PHOS: 67 IU/L (ref 39–117)
ALT: 18 IU/L (ref 0–32)
AST: 22 IU/L (ref 0–40)
BILIRUBIN TOTAL: 0.9 mg/dL (ref 0.0–1.2)
BUN / CREAT RATIO: 16 (ref 12–28)
BUN: 15 mg/dL (ref 8–27)
CHLORIDE: 104 mmol/L (ref 96–106)
CO2: 22 mmol/L (ref 20–29)
CREATININE: 0.94 mg/dL (ref 0.57–1.00)
Calcium: 9.4 mg/dL (ref 8.7–10.3)
GFR calc Af Amer: 70 mL/min/{1.73_m2} (ref >59)
GFR calc non Af Amer: 61 mL/min/{1.73_m2} (ref >59)
GLOBULIN, TOTAL: 2.3 g/dL (ref 1.5–4.5)
Glucose: 101 mg/dL — ABNORMAL HIGH (ref 65–99)
POTASSIUM: 4.4 mmol/L (ref 3.5–5.2)
SODIUM: 142 mmol/L (ref 134–144)
Total Protein: 6.5 g/dL (ref 6.0–8.5)

## 2018-05-13 LAB — CBC WITH DIFFERENTIAL/PLATELET
Basophils Absolute: 0 10*3/uL (ref 0.0–0.2)
Basos: 1 %
EOS (ABSOLUTE): 0 10*3/uL (ref 0.0–0.4)
EOS: 1 %
HEMATOCRIT: 37.3 % (ref 34.0–46.6)
HEMOGLOBIN: 12.7 g/dL (ref 11.1–15.9)
Immature Grans (Abs): 0 10*3/uL (ref 0.0–0.1)
Immature Granulocytes: 0 %
LYMPHS ABS: 0.7 10*3/uL (ref 0.7–3.1)
Lymphs: 17 %
MCH: 28.9 pg (ref 26.6–33.0)
MCHC: 34 g/dL (ref 31.5–35.7)
MCV: 85 fL (ref 79–97)
MONOCYTES: 8 %
Monocytes Absolute: 0.3 10*3/uL (ref 0.1–0.9)
NEUTROS ABS: 2.9 10*3/uL (ref 1.4–7.0)
Neutrophils: 73 %
Platelets: 202 10*3/uL (ref 150–450)
RBC: 4.39 x10E6/uL (ref 3.77–5.28)
RDW: 13.4 % (ref 11.7–15.4)
WBC: 4 10*3/uL (ref 3.4–10.8)

## 2018-05-13 LAB — LIPID PANEL
CHOLESTEROL TOTAL: 127 mg/dL (ref 100–199)
Chol/HDL Ratio: 2.7 ratio (ref 0.0–4.4)
HDL: 47 mg/dL (ref >39)
LDL CALC: 69 mg/dL (ref 0–99)
TRIGLYCERIDES: 57 mg/dL (ref 0–149)
VLDL Cholesterol Cal: 11 mg/dL (ref 5–40)

## 2018-05-13 LAB — TSH: TSH: 2.01 u[IU]/mL (ref 0.450–4.500)

## 2018-05-13 LAB — VITAMIN D 25 HYDROXY (VIT D DEFICIENCY, FRACTURES): VIT D 25 HYDROXY: 46.9 ng/mL (ref 30.0–100.0)

## 2018-05-15 NOTE — Progress Notes (Signed)
BP 112/62   Pulse (!) 52   Temp 98 F (36.7 C) (Oral)   Ht 5' 2" (1.575 m)   Wt 187 lb 3.2 oz (84.9 kg)   BMI 34.24 kg/m    Subjective:    Patient ID: Jaclyn Lowe, female    DOB: [birth year], 73 y.o.   MRN: [id number]  HPI: Jaclyn Lowe is a 73 y.o. female presenting on 05/12/2018 for Hypertension  Comes in for periodic recheck for multiple conditions.  She has vitamin D deficiency, weight gain, intrinsic eczema, obesity with a BMI of 34.  Is currently working very hard on her diet and exercise again.  She will did attend the obesity clinic and had a very good response from a calorie controlled accountability program.  She is having increased eczema on her hands.  Sometimes it even gets red and swollen has even drained.  She has known eczema over the years.  Past Medical History:  Diagnosis Date  . A-fib ([place])   . Back pain   . Bell's palsy 2010  . Cancer ([place])    basal cell on the nose  . Edema    feet and legs  . Gallbladder disease   . HTN (hypertension)   . Hyperlipidemia   . Joint pain   . Loss of smell   . Loss of taste   . Palpitations   . Prediabetes   . Rectoceles   . SOB (shortness of breath)   . Swallowing difficulty   . Varicose vein of leg    Relevant past medical, surgical, family and social history reviewed and updated as indicated. Interim medical history since our last visit reviewed. Allergies and medications reviewed and updated. DATA REVIEWED: CHART IN EPIC  Family History reviewed for pertinent findings.  Review of Systems  Constitutional: Negative.   HENT: Negative.   Eyes: Negative.   Respiratory: Negative.   Gastrointestinal: Negative.   Genitourinary: Negative.   Skin: Positive for color change and rash.    Allergies as of 05/12/2018      Reactions   Neosporin [neomycin-bacitracin Zn-polymyx] Itching, Rash      Medication List       Accurate as of May 12, 2018 11:59 PM. Always use your most recent med list.         atorvastatin 20 MG tablet Commonly known as:  LIPITOR TAKE 1 2 TABLET BY MOUTH ONCE DAILY   cephALEXin 500 MG capsule Commonly known as:  KEFLEX Take 1 capsule (500 mg total) by mouth 4 (four) times daily.   Eliquis 5 MG Tabs tablet Generic drug:  apixaban Take 5 mg by mouth 2 (two) times daily.   hydrochlorothiazide 12.5 MG tablet Commonly known as:  HYDRODIURIL Take 1 tablet (12.5 mg total) by mouth daily as needed.   lisinopril 20 MG tablet Commonly known as:  PRINIVIL,ZESTRIL Take 1 tablet (20 mg total) by mouth daily.   silver sulfADIAZINE 1 % cream Commonly known as:  Silvadene Apply 1 application topically 2 (two) times daily.   triamcinolone cream 0.5 % Commonly known as:  KENALOG Apply 1 application topically 3 (three) times daily.   Vitamin D (Ergocalciferol) 1.25 MG (50000 UT) Caps capsule Commonly known as:  DRISDOL Take 1 capsule (50,000 Units total) by mouth every 7 (seven) days.          Objective:    BP 112/62   Pulse (!) 52   Temp 98 F (36.7 C) (Oral)   Ht 5' 2" (1.575  m)   Wt 187 lb 3.2 oz (84.9 kg)   BMI 34.24 kg/m   Allergies  Allergen Reactions  . Neosporin [Neomycin-Bacitracin Zn-Polymyx] Itching and Rash    Wt Readings from Last 3 Encounters:  05/12/18 187 lb 3.2 oz (84.9 kg)  12/08/17 187 lb (84.8 kg)  11/22/17 186 lb (84.4 kg)    Physical Exam Constitutional:      Appearance: She is well-developed.  HENT:     Head: Normocephalic and atraumatic.  Eyes:     Conjunctiva/sclera: Conjunctivae normal.     Pupils: Pupils are equal, round, and reactive to light.  Cardiovascular:     Rate and Rhythm: Normal rate and regular rhythm.     Heart sounds: Normal heart sounds.  Pulmonary:     Effort: Pulmonary effort is normal.     Breath sounds: Normal breath sounds.  Abdominal:     General: Bowel sounds are normal.     Palpations: Abdomen is soft.  Skin:    General: Skin is warm and dry.     Findings: No rash.  Neurological:      Mental Status: She is alert and oriented to person, place, and time.     Deep Tendon Reflexes: Reflexes are normal and symmetric.  Psychiatric:        Behavior: Behavior normal.        Thought Content: Thought content normal.        Judgment: Judgment normal.     Results for orders placed or performed in visit on 05/12/18  CBC with Differential/Platelet  Result Value Ref Range   WBC 4.0 3.4 - 10.8 x10E3/uL   RBC 4.39 3.77 - 5.28 x10E6/uL   Hemoglobin 12.7 11.1 - 15.9 g/dL   Hematocrit 37.3 34.0 - 46.6 %   MCV 85 79 - 97 fL   MCH 28.9 26.6 - 33.0 pg   MCHC 34.0 31.5 - 35.7 g/dL   RDW 13.4 11.7 - 15.4 %   Platelets 202 150 - 450 x10E3/uL   Neutrophils 73 Not Estab. %   Lymphs 17 Not Estab. %   Monocytes 8 Not Estab. %   Eos 1 Not Estab. %   Basos 1 Not Estab. %   Neutrophils Absolute 2.9 1.4 - 7.0 x10E3/uL   Lymphocytes Absolute 0.7 0.7 - 3.1 x10E3/uL   Monocytes Absolute 0.3 0.1 - 0.9 x10E3/uL   EOS (ABSOLUTE) 0.0 0.0 - 0.4 x10E3/uL   Basophils Absolute 0.0 0.0 - 0.2 x10E3/uL   Immature Granulocytes 0 Not Estab. %   Immature Grans (Abs) 0.0 0.0 - 0.1 x10E3/uL  CMP14+EGFR  Result Value Ref Range   Glucose 101 (H) 65 - 99 mg/dL   BUN 15 8 - 27 mg/dL   Creatinine, Ser 0.94 0.57 - 1.00 mg/dL   GFR calc non Af Amer 61 >59 mL/min/1.73   GFR calc Af Amer 70 >59 mL/min/1.73   BUN/Creatinine Ratio 16 12 - 28   Sodium 142 134 - 144 mmol/L   Potassium 4.4 3.5 - 5.2 mmol/L   Chloride 104 96 - 106 mmol/L   CO2 22 20 - 29 mmol/L   Calcium 9.4 8.7 - 10.3 mg/dL   Total Protein 6.5 6.0 - 8.5 g/dL   Albumin 4.2 3.7 - 4.7 g/dL   Globulin, Total 2.3 1.5 - 4.5 g/dL   Albumin/Globulin Ratio 1.8 1.2 - 2.2   Bilirubin Total 0.9 0.0 - 1.2 mg/dL   Alkaline Phosphatase 67 39 - 117 IU/L   AST 22 0 -  40 IU/L   ALT 18 0 - 32 IU/L  Lipid panel  Result Value Ref Range   Cholesterol, Total 127 100 - 199 mg/dL   Triglycerides 57 0 - 149 mg/dL   HDL 47 >39 mg/dL   VLDL Cholesterol Cal 11 5 -  40 mg/dL   LDL Calculated 69 0 - 99 mg/dL   Chol/HDL Ratio 2.7 0.0 - 4.4 ratio  Bayer DCA Hb A1c Waived  Result Value Ref Range   HB A1C (BAYER DCA - WAIVED) 5.7 <7.0 %  TSH  Result Value Ref Range   TSH 2.010 0.450 - 4.500 uIU/mL  VITAMIN D 25 Hydroxy (Vit-D Deficiency, Fractures)  Result Value Ref Range   Vit D, 25-Hydroxy 46.9 30.0 - 100.0 ng/mL      Assessment & Plan:   1. Vitamin D deficiency - VITAMIN D 25 Hydroxy (Vit-D Deficiency, Fractures)  2. Well adult exam - CBC with Differential/Platelet - CMP14+EGFR - Lipid panel - Bayer DCA Hb A1c Waived - TSH  3. Weight gain - TSH  4. Intrinsic eczema - triamcinolone cream (KENALOG) 0.5 %; Apply 1 application topically 3 (three) times daily.  Dispense: 60 g; Refill: 6 - silver sulfADIAZINE (SILVADENE) 1 % cream; Apply 1 application topically 2 (two) times daily.  Dispense: 400 g; Refill: 0  5. Class 1 obesity with serious comorbidity and body mass index (BMI) of 33.0 to 33.9 in adult, unspecified obesity type Dietary measures to be restarted as she has been done before Will recheck in 2 months for maintenance   Continue all other maintenance medications as listed above.  Follow up plan: Return in about 2 months (around 07/12/2018).  Educational handout given for [place] PA-C [place] [address]  [place], [place] [address] [phone]   05/15/2018, 12:54 AM

## 2018-06-08 ENCOUNTER — Other Ambulatory Visit: Payer: Self-pay | Admitting: Physician Assistant

## 2018-06-08 DIAGNOSIS — L2084 Intrinsic (allergic) eczema: Secondary | ICD-10-CM

## 2018-06-08 MED ORDER — CEPHALEXIN 500 MG PO CAPS: 500.0000 mg | ORAL_CAPSULE | Freq: Four times a day (QID) | ORAL | 0 refills | Status: DC

## 2018-06-08 MED ORDER — TRIAMCINOLONE ACETONIDE 0.5 % EX CREA: 1.0000 "application " | TOPICAL_CREAM | Freq: Three times a day (TID) | CUTANEOUS | 6 refills | Status: AC

## 2018-07-07 ENCOUNTER — Encounter: Payer: Self-pay | Admitting: Physician Assistant

## 2018-07-07 ENCOUNTER — Other Ambulatory Visit: Payer: Self-pay

## 2018-07-07 ENCOUNTER — Ambulatory Visit (INDEPENDENT_AMBULATORY_CARE_PROVIDER_SITE_OTHER): Payer: Medicare Other | Admitting: Physician Assistant

## 2018-07-07 DIAGNOSIS — E785 Hyperlipidemia, unspecified: Secondary | ICD-10-CM | POA: Diagnosis not present

## 2018-07-07 DIAGNOSIS — E559 Vitamin D deficiency, unspecified: Secondary | ICD-10-CM | POA: Diagnosis not present

## 2018-07-07 DIAGNOSIS — R635 Abnormal weight gain: Secondary | ICD-10-CM | POA: Insufficient documentation

## 2018-07-07 DIAGNOSIS — I10 Essential (primary) hypertension: Secondary | ICD-10-CM

## 2018-07-07 MED ORDER — VITAMIN D (ERGOCALCIFEROL) 1.25 MG (50000 UNIT) PO CAPS: 50000.0000 [IU] | ORAL_CAPSULE | ORAL | 3 refills | Status: DC

## 2018-07-07 MED ORDER — ATORVASTATIN CALCIUM 10 MG PO TABS: 10.0000 mg | ORAL_TABLET | Freq: Every day | ORAL | 3 refills | Status: DC

## 2018-07-07 MED ORDER — LISINOPRIL 20 MG PO TABS: 20.0000 mg | ORAL_TABLET | Freq: Every day | ORAL | 3 refills | Status: DC

## 2018-07-07 NOTE — Progress Notes (Signed)
Telephone visit  Subjective: FG:[id number] on chronic conditions PCP: Terald Sleeper, PA-C Jaclyn Lowe is a 73 y.o. female calls for telephone consult today. Patient provides verbal consent for consult held via phone.  Patient is identified with 2 separate identifiers.  At this time the entire area is on COVID-19 social distancing and stay home orders are in place.  Patient is of higher risk and therefore we are performing this by a virtual method.  Location of patient: home Location of provider: [facility] Others present for call: no  This patient comes in for periodic recheck on medications and conditions including hypertension, vitamin D deficiency, hyperlipidemia, weight gain.  She has been trying diligently to work on diet and exercise at the last visit.  However with COVID-19 restrictions and just the anxiety and stress that is going on she knows she is probably gained about 10 pounds.  She states that she has not been can controlling her eating, and having bad food choices at times.  She does hope to get walking and active again.  She does need refills on her medications.  Her lab work was performed in March and everything looked very good then..   All medications are reviewed today. There are no reports of any problems with the medications. All of the medical conditions are reviewed and updated.  Lab work is reviewed and will be ordered as medically necessary. There are no new problems reported with today's visit.    ROS: Per HPI  Allergies  Allergen Reactions  . Neosporin [Neomycin-Bacitracin Zn-Polymyx] Itching and Rash   Past Medical History:  Diagnosis Date  . A-fib ([place])   . Back pain   . Bell's palsy 2010  . Cancer ([place])    basal cell on the nose  . Edema    feet and legs  . Gallbladder disease   . HTN (hypertension)   . Hyperlipidemia   . Joint pain   . Loss of smell   . Loss of taste   . Palpitations   . Prediabetes   . Rectoceles   . SOB (shortness of  breath)   . Swallowing difficulty   . Varicose vein of leg     Current Outpatient Medications:  .  apixaban (ELIQUIS) 5 MG TABS tablet, Take 5 mg by mouth 2 (two) times daily., Disp: , Rfl:  .  atorvastatin (LIPITOR) 10 MG tablet, Take 1 tablet (10 mg total) by mouth daily., Disp: 90 tablet, Rfl: 3 .  hydrochlorothiazide (HYDRODIURIL) 12.5 MG tablet, Take 1 tablet (12.5 mg total) by mouth daily as needed., Disp: 90 tablet, Rfl: 3 .  lisinopril (ZESTRIL) 20 MG tablet, Take 1 tablet (20 mg total) by mouth daily., Disp: 90 tablet, Rfl: 3 .  silver sulfADIAZINE (SILVADENE) 1 % cream, Apply 1 application topically 2 (two) times daily., Disp: 400 g, Rfl: 0 .  triamcinolone cream (KENALOG) 0.5 %, Apply 1 application topically 3 (three) times daily., Disp: 60 g, Rfl: 6 .  Vitamin D, Ergocalciferol, (DRISDOL) 1.25 MG (50000 UT) CAPS capsule, Take 1 capsule (50,000 Units total) by mouth every 7 (seven) days., Disp: 13 capsule, Rfl: 3  Assessment/ Plan: 73 y.o. female   1. Essential hypertension - lisinopril (ZESTRIL) 20 MG tablet; Take 1 tablet (20 mg total) by mouth daily.  Dispense: 90 tablet; Refill: 3  2. Vitamin D deficiency - Vitamin D, Ergocalciferol, (DRISDOL) 1.25 MG (50000 UT) CAPS capsule; Take 1 capsule (50,000 Units total) by mouth every 7 (seven)  days.  Dispense: 13 capsule; Refill: 3  3. Weight gain Diet and exercise  4. Hyperlipidemia, unspecified hyperlipidemia type - atorvastatin (LIPITOR) 10 MG tablet; Take 1 tablet (10 mg total) by mouth daily.  Dispense: 90 tablet; Refill: 3   Start time: 10:33 AM End time: 10:43 AM  Meds ordered this encounter  Medications  . lisinopril (ZESTRIL) 20 MG tablet    Sig: Take 1 tablet (20 mg total) by mouth daily.    Dispense:  90 tablet    Refill:  3    Please consider 90 day supplies to promote better adherence    Order Specific Question:   Supervising Provider    Answer:   Janora Norlander [[id number]]  . Vitamin D,  Ergocalciferol, (DRISDOL) 1.25 MG (50000 UT) CAPS capsule    Sig: Take 1 capsule (50,000 Units total) by mouth every 7 (seven) days.    Dispense:  13 capsule    Refill:  3    Please consider 90 day supplies to promote better adherence    Order Specific Question:   Supervising Provider    Answer:   Janora Norlander [[id number]]  . atorvastatin (LIPITOR) 10 MG tablet    Sig: Take 1 tablet (10 mg total) by mouth daily.    Dispense:  90 tablet    Refill:  3    Order Specific Question:   Supervising Provider    Answer:   Janora Norlander [[id number]]    Jaclyn Nearing PA-C [place] [phone]

## 2018-10-25 ENCOUNTER — Other Ambulatory Visit: Payer: Self-pay

## 2018-10-25 DIAGNOSIS — R6889 Other general symptoms and signs: Secondary | ICD-10-CM | POA: Diagnosis not present

## 2018-10-25 DIAGNOSIS — Z20822 Contact with and (suspected) exposure to covid-19: Secondary | ICD-10-CM

## 2018-10-26 LAB — NOVEL CORONAVIRUS, NAA: SARS-CoV-2, NAA: NOT DETECTED

## 2018-12-16 DIAGNOSIS — Z23 Encounter for immunization: Secondary | ICD-10-CM | POA: Diagnosis not present

## 2019-04-03 DIAGNOSIS — H40019 Open angle with borderline findings, low risk, unspecified eye: Secondary | ICD-10-CM | POA: Diagnosis not present

## 2019-04-03 DIAGNOSIS — H25011 Cortical age-related cataract, right eye: Secondary | ICD-10-CM | POA: Diagnosis not present

## 2019-04-03 DIAGNOSIS — H524 Presbyopia: Secondary | ICD-10-CM | POA: Diagnosis not present

## 2019-04-03 DIAGNOSIS — H52213 Irregular astigmatism, bilateral: Secondary | ICD-10-CM | POA: Diagnosis not present

## 2019-04-03 DIAGNOSIS — H538 Other visual disturbances: Secondary | ICD-10-CM | POA: Diagnosis not present

## 2019-04-03 DIAGNOSIS — H35371 Puckering of macula, right eye: Secondary | ICD-10-CM | POA: Diagnosis not present

## 2019-07-24 ENCOUNTER — Other Ambulatory Visit: Payer: Self-pay

## 2019-07-24 ENCOUNTER — Encounter: Payer: Self-pay | Admitting: Nurse Practitioner

## 2019-07-24 ENCOUNTER — Ambulatory Visit (INDEPENDENT_AMBULATORY_CARE_PROVIDER_SITE_OTHER): Payer: Medicare Other | Admitting: Nurse Practitioner

## 2019-07-24 VITALS — BP 142/84 | HR 58 | Temp 97.1°F | Resp 20 | Ht 62.0 in | Wt 210.0 lb

## 2019-07-24 DIAGNOSIS — I10 Essential (primary) hypertension: Secondary | ICD-10-CM | POA: Diagnosis not present

## 2019-07-24 DIAGNOSIS — E669 Obesity, unspecified: Secondary | ICD-10-CM

## 2019-07-24 DIAGNOSIS — E782 Mixed hyperlipidemia: Secondary | ICD-10-CM | POA: Diagnosis not present

## 2019-07-24 DIAGNOSIS — Z6831 Body mass index (BMI) 31.0-31.9, adult: Secondary | ICD-10-CM

## 2019-07-24 DIAGNOSIS — F3289 Other specified depressive episodes: Secondary | ICD-10-CM

## 2019-07-24 DIAGNOSIS — Z7901 Long term (current) use of anticoagulants: Secondary | ICD-10-CM | POA: Diagnosis not present

## 2019-07-24 DIAGNOSIS — I482 Chronic atrial fibrillation, unspecified: Secondary | ICD-10-CM

## 2019-07-24 DIAGNOSIS — E559 Vitamin D deficiency, unspecified: Secondary | ICD-10-CM

## 2019-07-24 MED ORDER — LISINOPRIL 20 MG PO TABS: 20.0000 mg | ORAL_TABLET | Freq: Every day | ORAL | 1 refills | Status: AC

## 2019-07-24 MED ORDER — ATORVASTATIN CALCIUM 10 MG PO TABS: 10.0000 mg | ORAL_TABLET | Freq: Every day | ORAL | 1 refills | Status: AC

## 2019-07-24 MED ORDER — CONTRAVE 8-90 MG PO TB12: ORAL_TABLET | ORAL | 0 refills | Status: AC

## 2019-07-24 MED ORDER — VITAMIN D (ERGOCALCIFEROL) 1.25 MG (50000 UNIT) PO CAPS: 50000.0000 [IU] | ORAL_CAPSULE | ORAL | 3 refills | Status: AC

## 2019-07-24 MED ORDER — APIXABAN 5 MG PO TABS: 5.0000 mg | ORAL_TABLET | Freq: Two times a day (BID) | ORAL | 1 refills | Status: AC

## 2019-07-24 MED ORDER — CONTRAVE 8-90 MG PO TB12: 2.0000 | ORAL_TABLET | Freq: Two times a day (BID) | ORAL | 2 refills | Status: AC

## 2019-07-24 NOTE — Progress Notes (Addendum)
Subjective:    Patient ID: Jaclyn Lowe, female    DOB: 1946/02/19, 74 y.o.   MRN: [id number]   Chief Complaint: Medical Management of Chronic Issues    HPI:  1. Essential hypertension No c/o chest pain, Sob or headache. Does check blood pressure at home. And ranges from 287-68'T sytolic BP Readings from Last 3 Encounters:  05/12/18 112/62  02/23/18 138/82  12/08/17 (!) 183/77     2. Mixed hyperlipidemia Does not watch diet and does little to no exercise. Lab Results  Component Value Date   CHOL 127 05/12/2018   HDL 47 05/12/2018   LDLCALC 69 05/12/2018   TRIG 57 05/12/2018   CHOLHDL 2.7 05/12/2018     3. Chronic atrial fibrillation (HCC) She is on eliquis daily, denies any bleeding.  4. Chronic anticoagulation Again is o eliquis with no bleeding  5. Other depression Is doing well. Depression screen [facility] 2/9 07/24/2019 05/12/2018 02/23/2018  Decreased Interest 1 0 0  Down, Depressed, Hopeless 1 0 0  PHQ - 2 Score 2 0 0  Altered sleeping 1 - -  Tired, decreased energy 0 - -  Change in appetite 0 - -  Feeling bad or failure about yourself  0 - -  Trouble concentrating 0 - -  Moving slowly or fidgety/restless 0 - -  Suicidal thoughts 0 - -  PHQ-9 Score 3 - -     6. Vitamin D deficiency Is on vitamin d supplements  7. Class 1 obesity with serious comorbidity and body mass index (BMI) of 31.0 to 31.9 in adult, unspecified obesity type Weight is up 23lb since last visit. She I very concerned about hr weight gain. SHe has used topamax in the past and that helped her looe weight in the past Wt Readings from Last 3 Encounters:  07/24/19 210 lb (95.3 kg)  05/12/18 187 lb 3.2 oz (84.9 kg)  12/08/17 187 lb (84.8 kg)   BMI Readings from Last 3 Encounters:  07/24/19 38.41 kg/m  05/12/18 34.24 kg/m  02/23/18 34.20 kg/m       Outpatient Encounter Medications as of 07/24/2019  Medication Sig  . apixaban (ELIQUIS) 5 MG TABS tablet Take 5 mg by mouth 2 (two)  times daily.  Marland Kitchen atorvastatin (LIPITOR) 10 MG tablet Take 1 tablet (10 mg total) by mouth daily.  Marland Kitchen lisinopril (ZESTRIL) 20 MG tablet Take 1 tablet (20 mg total) by mouth daily.  . silver sulfADIAZINE (SILVADENE) 1 % cream Apply 1 application topically 2 (two) times daily.  Marland Kitchen triamcinolone cream (KENALOG) 0.5 % Apply 1 application topically 3 (three) times daily.  . Vitamin D, Ergocalciferol, (DRISDOL) 1.25 MG (50000 UT) CAPS capsule Take 1 capsule (50,000 Units total) by mouth every 7 (seven) days.    Past Surgical History:  Procedure Laterality Date  . ANKLE SURGERY     left ankle  . AV FISTULA PLACEMENT     leg  . CHOLECYSTECTOMY    . GASTRIC BYPASS    . tummy tuck    . VEIN REPAIR      Family History  Problem Relation Age of Onset  . Diabetes Mother   . Hyperlipidemia Mother   . Sudden death Mother   . Obesity Mother   . Hypertension Father   . Hyperlipidemia Father   . Heart disease Father   . Sudden death Father     New complaints: None today  Social history: Lives with husband. 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  Eyes: Negative for pain.  Respiratory: Negative for shortness of breath.   Cardiovascular: Negative for chest pain, palpitations and leg swelling.  Gastrointestinal: Negative for abdominal pain.  Endocrine: Negative for polydipsia.  Skin: Negative for rash.  Neurological: Negative for dizziness, weakness and headaches.  Hematological: Does not bruise/bleed easily.  All other systems reviewed and are negative.      Objective:   Physical Exam Vitals and nursing note reviewed.  Constitutional:      General: She is not in acute distress.    Appearance: Normal appearance. She is well-developed.  HENT:     Head: Normocephalic.     Nose: Nose normal.  Eyes:     Pupils: Pupils are equal, round, and reactive to light.  Neck:     Vascular: No carotid bruit or  JVD.  Cardiovascular:     Rate and Rhythm: Normal rate and regular rhythm.     Heart sounds: Normal heart sounds.  Pulmonary:     Effort: Pulmonary effort is normal. No respiratory distress.     Breath sounds: Normal breath sounds. No wheezing or rales.  Chest:     Chest wall: No tenderness.  Abdominal:     General: Bowel sounds are normal. There is no distension or abdominal bruit.     Palpations: Abdomen is soft. There is no hepatomegaly, splenomegaly, mass or pulsatile mass.     Tenderness: There is no abdominal tenderness.  Musculoskeletal:        General: Normal range of motion.     Cervical back: Normal range of motion and neck supple.  Lymphadenopathy:     Cervical: No cervical adenopathy.  Skin:    General: Skin is warm and dry.  Neurological:     Mental Status: She is alert and oriented to person, place, and time.     Deep Tendon Reflexes: Reflexes are normal and symmetric.  Psychiatric:        Behavior: Behavior normal.        Thought Content: Thought content normal.        Judgment: Judgment normal.     Blood pressure (!) 142/84, pulse (!) 58, temperature (!) 97.1 F (36.2 C), temperature source Temporal, resp. rate 20, height '5\' 2"'$  (1.575 m), weight 210 lb (95.3 kg), SpO2 100 %.         Assessment & Plan:  Jaclyn Lowe comes in today with chief complaint of Medical Management of Chronic Issues   Diagnosis and orders addressed:  1. Essential hypertension Low sodium diet - lisinopril (ZESTRIL) 20 MG tablet; Take 1 tablet (20 mg total) by mouth daily.  Dispense: 90 tablet; Refill: 1 - CMP14+EGFR - CBC with Differential/Platelet  2. Mixed hyperlipidemia Low fat diet - atorvastatin (LIPITOR) 10 MG tablet; Take 1 tablet (10 mg total) by mouth daily.  Dispense: 90 tablet; Refill: 1 - Lipid panel  3. Chronic atrial fibrillation (HCC) Avoid caffeine - apixaban (ELIQUIS) 5 MG TABS tablet; Take 1 tablet (5 mg total) by mouth 2 (two) times daily.  Dispense: 180  tablet; Refill: 1  4. Chronic anticoagulation Continue eliquis  5. Other depression Stress management  6. Vitamin D deficiency Continue vitamin d - Vitamin D, Ergocalciferol, (DRISDOL) 1.25 MG (50000 UNIT) CAPS capsule; Take 1 capsule (50,000 Units total) by mouth every 7 (seven) days.  Dispense: 13 capsule; Refill: 3  7. Class 1 obesity with serious comorbidity and body mass index (BMI)  of 31.0 to 31.9 in adult, unspecified obesity type Discussed diet and exercise for person with BMI >25 Will recheck weight in 3-6 months contrave a rx   Labs pending Health Maintenance reviewed Diet and exercise encouraged  Follow up plan: 6 months   [place], FNP

## 2019-07-24 NOTE — Addendum Note (Signed)
Addended by: Chevis Pretty on: 07/24/2019 12:44 PM   Modules accepted: Orders

## 2019-07-24 NOTE — Patient Instructions (Signed)
DASH Eating Plan DASH stands for "Dietary Approaches to Stop Hypertension." The DASH eating plan is a healthy eating plan that has been shown to reduce high blood pressure (hypertension). It may also reduce your risk for type 2 diabetes, heart disease, and stroke. The DASH eating plan may also help with weight loss. What are tips for following this plan?  General guidelines  Avoid eating more than 2,300 mg (milligrams) of salt (sodium) a day. If you have hypertension, you may need to reduce your sodium intake to 1,500 mg a day.  Limit alcohol intake to no more than 1 drink a day for nonpregnant women and 2 drinks a day for men. One drink equals 12 oz of beer, 5 oz of wine, or 1 oz of hard liquor.  Work with your health care provider to maintain a healthy body weight or to lose weight. Ask what an ideal weight is for you.  Get at least 30 minutes of exercise that causes your heart to beat faster (aerobic exercise) most days of the week. Activities may include walking, swimming, or biking.  Work with your health care provider or diet and nutrition specialist (dietitian) to adjust your eating plan to your individual calorie needs. Reading food labels   Check food labels for the amount of sodium per serving. Choose foods with less than 5 percent of the Daily Value of sodium. Generally, foods with less than 300 mg of sodium per serving fit into this eating plan.  To find whole grains, look for the word "whole" as the first word in the ingredient list. Shopping  Buy products labeled as "low-sodium" or "no salt added."  Buy fresh foods. Avoid canned foods and premade or frozen meals. Cooking  Avoid adding salt when cooking. Use salt-free seasonings or herbs instead of table salt or sea salt. Check with your health care provider or pharmacist before using salt substitutes.  Do not fry foods. Cook foods using healthy methods such as baking, boiling, grilling, and broiling instead.  Cook with  heart-healthy oils, such as olive, canola, soybean, or sunflower oil. Meal planning  Eat a balanced diet that includes: ? 5 or more servings of fruits and vegetables each day. At each meal, try to fill half of your plate with fruits and vegetables. ? Up to 6-8 servings of whole grains each day. ? Less than 6 oz of lean meat, poultry, or fish each day. A 3-oz serving of meat is about the same size as a deck of cards. One egg equals 1 oz. ? 2 servings of low-fat dairy each day. ? A serving of nuts, seeds, or beans 5 times each week. ? Heart-healthy fats. Healthy fats called Omega-3 fatty acids are found in foods such as flaxseeds and coldwater fish, like sardines, salmon, and mackerel.  Limit how much you eat of the following: ? Canned or prepackaged foods. ? Food that is high in trans fat, such as fried foods. ? Food that is high in saturated fat, such as fatty meat. ? Sweets, desserts, sugary drinks, and other foods with added sugar. ? Full-fat dairy products.  Do not salt foods before eating.  Try to eat at least 2 vegetarian meals each week.  Eat more home-cooked food and less restaurant, buffet, and fast food.  When eating at a restaurant, ask that your food be prepared with less salt or no salt, if possible. What foods are recommended? The items listed may not be a complete list. Talk with your dietitian about   what dietary choices are best for you. Grains Whole-grain or whole-wheat bread. Whole-grain or whole-wheat pasta. Brown rice. Oatmeal. Quinoa. Bulgur. Whole-grain and low-sodium cereals. Pita bread. Low-fat, low-sodium crackers. Whole-wheat flour tortillas. Vegetables Fresh or frozen vegetables (raw, steamed, roasted, or grilled). Low-sodium or reduced-sodium tomato and vegetable juice. Low-sodium or reduced-sodium tomato sauce and tomato paste. Low-sodium or reduced-sodium canned vegetables. Fruits All fresh, dried, or frozen fruit. Canned fruit in natural juice (without  added sugar). Meat and other protein foods Skinless chicken or turkey. Ground chicken or turkey. Pork with fat trimmed off. Fish and seafood. Egg whites. Dried beans, peas, or lentils. Unsalted nuts, nut butters, and seeds. Unsalted canned beans. Lean cuts of beef with fat trimmed off. Low-sodium, lean deli meat. Dairy Low-fat (1%) or fat-free (skim) milk. Fat-free, low-fat, or reduced-fat cheeses. Nonfat, low-sodium ricotta or cottage cheese. Low-fat or nonfat yogurt. Low-fat, low-sodium cheese. Fats and oils Soft margarine without trans fats. Vegetable oil. Low-fat, reduced-fat, or light mayonnaise and salad dressings (reduced-sodium). Canola, safflower, olive, soybean, and sunflower oils. Avocado. Seasoning and other foods Herbs. Spices. Seasoning mixes without salt. Unsalted popcorn and pretzels. Fat-free sweets. What foods are not recommended? The items listed may not be a complete list. Talk with your dietitian about what dietary choices are best for you. Grains Baked goods made with fat, such as croissants, muffins, or some breads. Dry pasta or rice meal packs. Vegetables Creamed or fried vegetables. Vegetables in a cheese sauce. Regular canned vegetables (not low-sodium or reduced-sodium). Regular canned tomato sauce and paste (not low-sodium or reduced-sodium). Regular tomato and vegetable juice (not low-sodium or reduced-sodium). Pickles. Olives. Fruits Canned fruit in a light or heavy syrup. Fried fruit. Fruit in cream or butter sauce. Meat and other protein foods Fatty cuts of meat. Ribs. Fried meat. Bacon. Sausage. Bologna and other processed lunch meats. Salami. Fatback. Hotdogs. Bratwurst. Salted nuts and seeds. Canned beans with added salt. Canned or smoked fish. Whole eggs or egg yolks. Chicken or turkey with skin. Dairy Whole or 2% milk, cream, and half-and-half. Whole or full-fat cream cheese. Whole-fat or sweetened yogurt. Full-fat cheese. Nondairy creamers. Whipped toppings.  Processed cheese and cheese spreads. Fats and oils Butter. Stick margarine. Lard. Shortening. Ghee. Bacon fat. Tropical oils, such as coconut, palm kernel, or palm oil. Seasoning and other foods Salted popcorn and pretzels. Onion salt, garlic salt, seasoned salt, table salt, and sea salt. Worcestershire sauce. Tartar sauce. Barbecue sauce. Teriyaki sauce. Soy sauce, including reduced-sodium. Steak sauce. Canned and packaged gravies. Fish sauce. Oyster sauce. Cocktail sauce. Horseradish that you find on the shelf. Ketchup. Mustard. Meat flavorings and tenderizers. Bouillon cubes. Hot sauce and Tabasco sauce. Premade or packaged marinades. Premade or packaged taco seasonings. Relishes. Regular salad dressings. Where to find more information:  National Heart, Lung, and Blood Institute: www.nhlbi.nih.gov  American Heart Association: www.heart.org Summary  The DASH eating plan is a healthy eating plan that has been shown to reduce high blood pressure (hypertension). It may also reduce your risk for type 2 diabetes, heart disease, and stroke.  With the DASH eating plan, you should limit salt (sodium) intake to 2,300 mg a day. If you have hypertension, you may need to reduce your sodium intake to 1,500 mg a day.  When on the DASH eating plan, aim to eat more fresh fruits and vegetables, whole grains, lean proteins, low-fat dairy, and heart-healthy fats.  Work with your health care provider or diet and nutrition specialist (dietitian) to adjust your eating plan to your   individual calorie needs. This information is not intended to replace advice given to you by your health care provider. Make sure you discuss any questions you have with your health care provider. Document Revised: 01/28/2017 Document Reviewed: 02/09/2016 Elsevier Patient Education  2020 Elsevier Inc.  

## 2019-07-25 LAB — CBC WITH DIFFERENTIAL/PLATELET
Basophils Absolute: 0 10*3/uL (ref 0.0–0.2)
Basos: 1 %
EOS (ABSOLUTE): 0 10*3/uL (ref 0.0–0.4)
Eos: 1 %
Hematocrit: 37.2 % (ref 34.0–46.6)
Hemoglobin: 11.9 g/dL (ref 11.1–15.9)
Immature Grans (Abs): 0 10*3/uL (ref 0.0–0.1)
Immature Granulocytes: 0 %
Lymphocytes Absolute: 0.8 10*3/uL (ref 0.7–3.1)
Lymphs: 17 %
MCH: 29 pg (ref 26.6–33.0)
MCHC: 32 g/dL (ref 31.5–35.7)
MCV: 91 fL (ref 79–97)
Monocytes Absolute: 0.4 10*3/uL (ref 0.1–0.9)
Monocytes: 8 %
Neutrophils Absolute: 3.2 10*3/uL (ref 1.4–7.0)
Neutrophils: 73 %
Platelets: 182 10*3/uL (ref 150–450)
RBC: 4.1 x10E6/uL (ref 3.77–5.28)
RDW: 13.4 % (ref 11.7–15.4)
WBC: 4.4 10*3/uL (ref 3.4–10.8)

## 2019-07-25 LAB — CMP14+EGFR
ALT: 13 IU/L (ref 0–32)
AST: 18 IU/L (ref 0–40)
Albumin/Globulin Ratio: 2 (ref 1.2–2.2)
Albumin: 4.3 g/dL (ref 3.7–4.7)
Alkaline Phosphatase: 74 IU/L (ref 48–121)
BUN/Creatinine Ratio: 13 (ref 12–28)
BUN: 10 mg/dL (ref 8–27)
Bilirubin Total: 1.1 mg/dL (ref 0.0–1.2)
CO2: 24 mmol/L (ref 20–29)
Calcium: 8.8 mg/dL (ref 8.7–10.3)
Chloride: 107 mmol/L — ABNORMAL HIGH (ref 96–106)
Creatinine, Ser: 0.8 mg/dL (ref 0.57–1.00)
GFR calc Af Amer: 85 mL/min/{1.73_m2} (ref >59)
GFR calc non Af Amer: 73 mL/min/{1.73_m2} (ref >59)
Globulin, Total: 2.1 g/dL (ref 1.5–4.5)
Glucose: 95 mg/dL (ref 65–99)
Potassium: 4 mmol/L (ref 3.5–5.2)
Sodium: 143 mmol/L (ref 134–144)
Total Protein: 6.4 g/dL (ref 6.0–8.5)

## 2019-07-25 LAB — LIPID PANEL
Chol/HDL Ratio: 2.5 ratio (ref 0.0–4.4)
Cholesterol, Total: 124 mg/dL (ref 100–199)
HDL: 49 mg/dL (ref >39)
LDL Chol Calc (NIH): 61 mg/dL (ref 0–99)
Triglycerides: 67 mg/dL (ref 0–149)
VLDL Cholesterol Cal: 14 mg/dL (ref 5–40)

## 2019-07-26 DIAGNOSIS — Z01419 Encounter for gynecological examination (general) (routine) without abnormal findings: Secondary | ICD-10-CM | POA: Diagnosis not present

## 2019-07-26 DIAGNOSIS — Z1231 Encounter for screening mammogram for malignant neoplasm of breast: Secondary | ICD-10-CM | POA: Diagnosis not present

## 2019-10-04 IMAGING — RF DG SMALL BOWEL
2 series · 2 of 2 positions shown · non-contrast
Comparison: 07/12/2017 abdominal CT

CLINICAL DATA: Small bowel to transverse colonic connection noted
on colonoscopy. Evaluate for fistula.

EXAM:
SMALL BOWEL SERIES
TECHNIQUE: Following ingestion of thin barium, serial small bowel images were
obtained including spot views of the terminal ileum.
FLUOROSCOPY TIME:  Fluoroscopy Time:  30 seconds
Radiation Exposure Index (if provided by the fluoroscopic device):
6.1 mGy
Number of Acquired Spot Images: 6

[Series 1: fluoro_barium 2fps_bw · 0.18mm/px · 1 of 1 slices shown (1 of 2)]
[im 1/1]
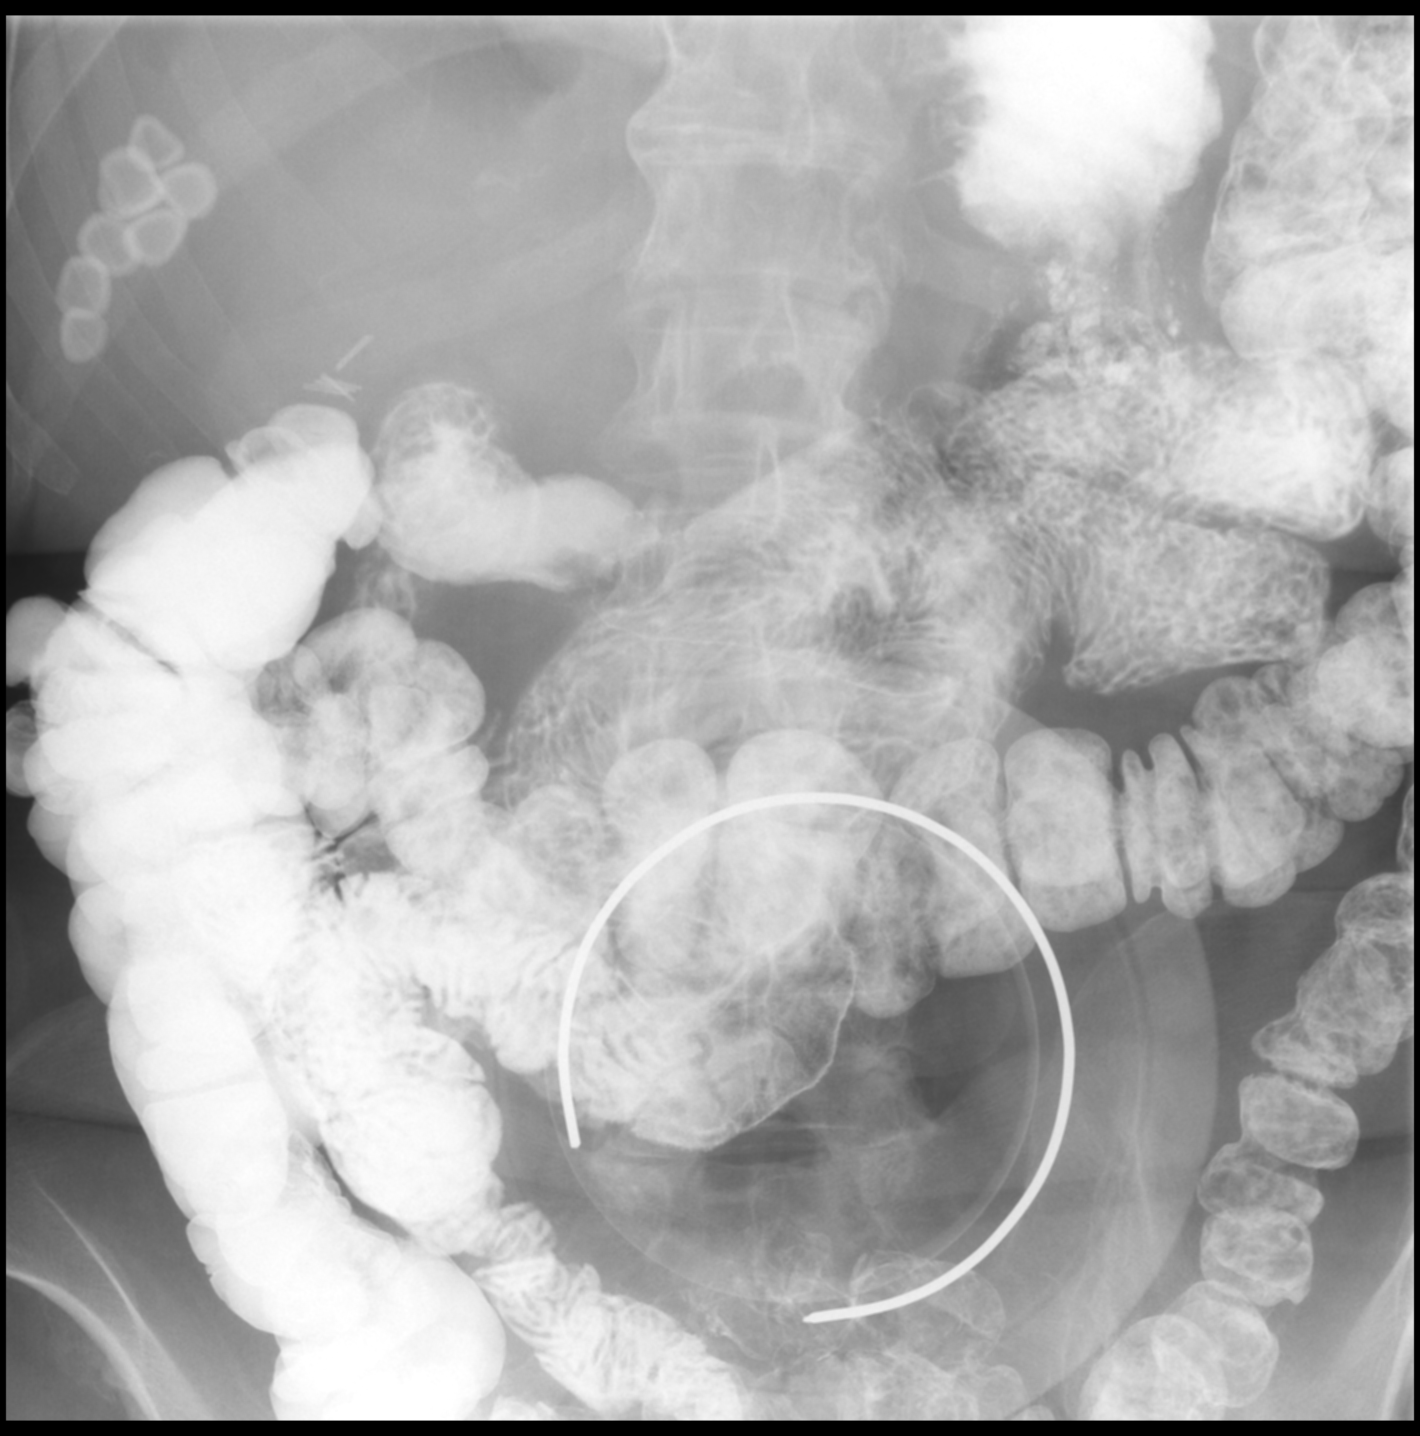

[Series 2: fluoro_barium 2fps_bw · 0.18mm/px · 1 of 1 slices shown (2 of 2)]
[im 1/1]
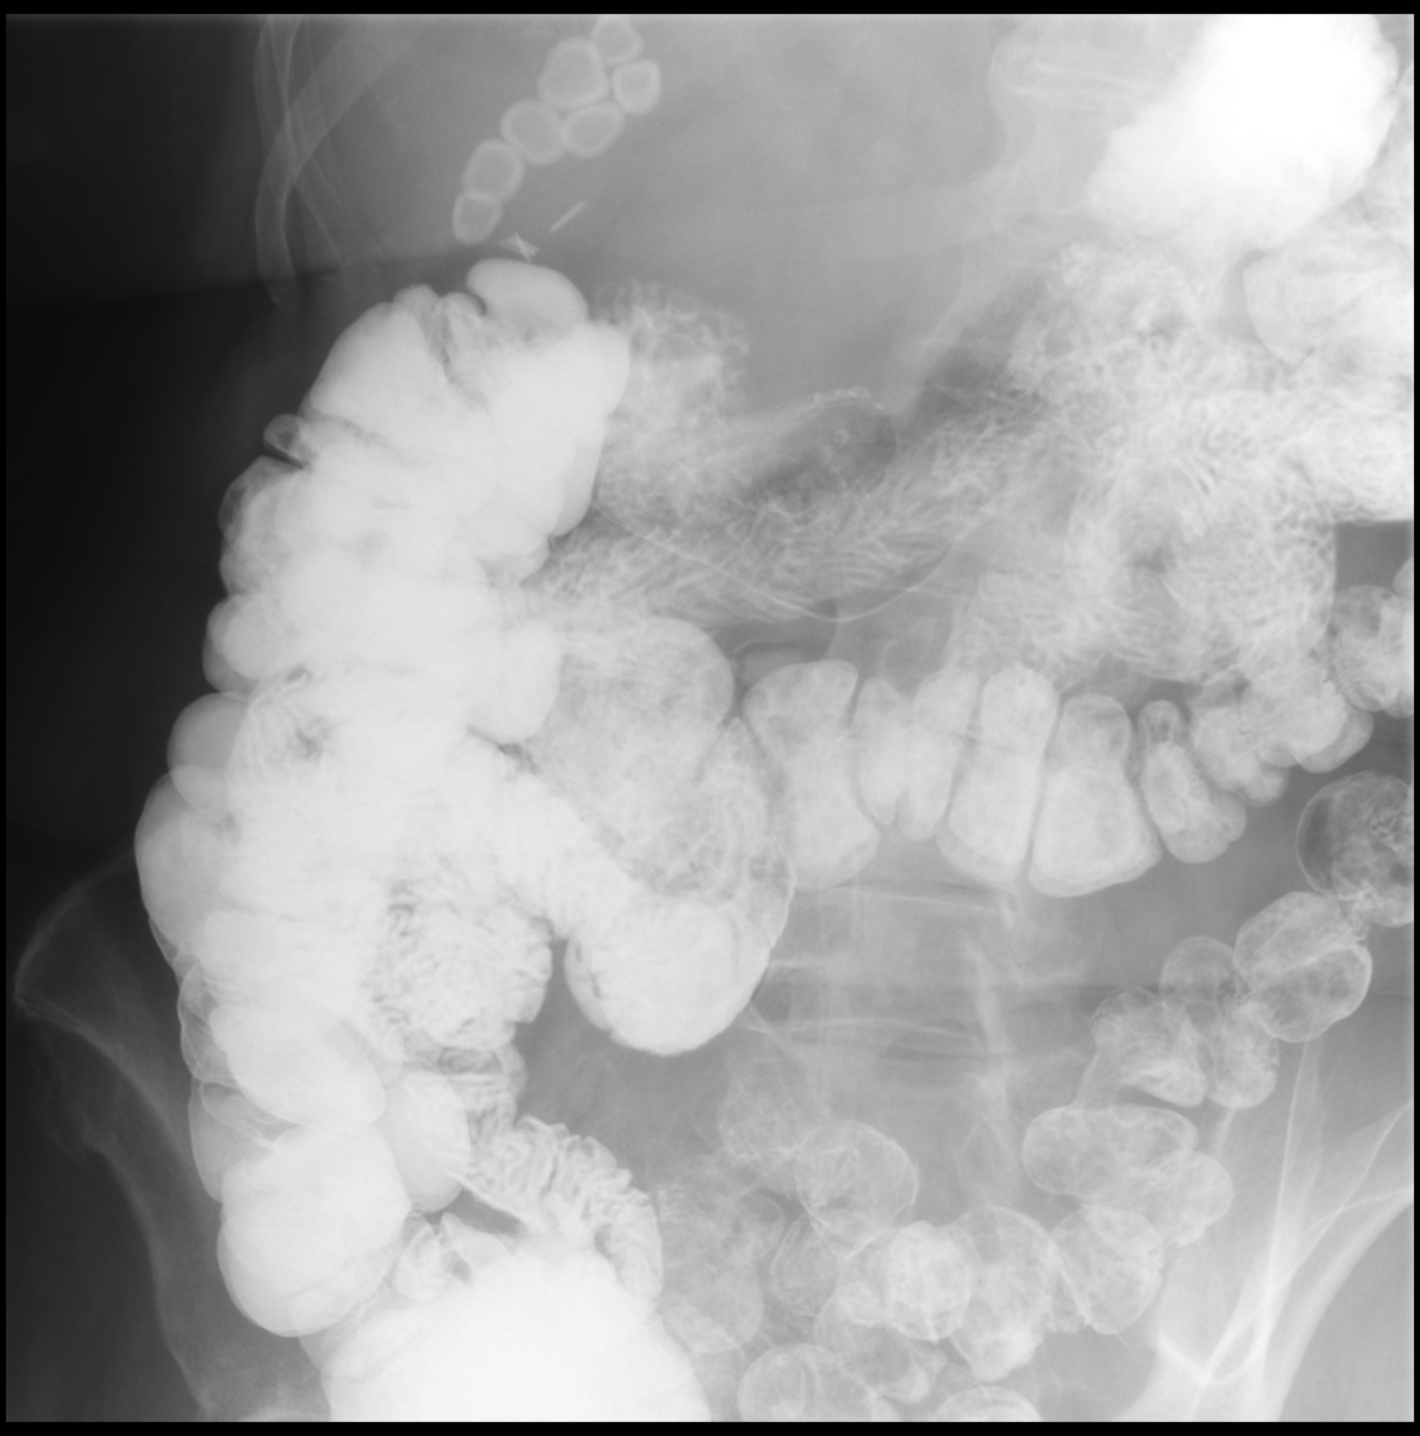

[2 of 2 positions shown; findings below may reference images not displayed]

FINDINGS: KUB shows gallstones on the right superimposed on cholecystectomy
clips. There has been cholecystectomy and the stones are dropped
along the right liver.

Normal shape and distensibility of the stomach with prompt emptying
into the duodenum. There is early filling of the right colon at the
ileocecal valve. This correlates with multiple loops of decompressed
small bowel in the left abdomen by CT, patient reports weight loss
surgery in 5599 and this is attributed to small bowel diversion.
Later in this series the mid transverse to colonic anastomosis is
seen, filling from uncertain direction in the enteric stream. Based
on CT this is a end to side anastomosis that appears
intentional/surgical. There is no regional fold thickening or
ulceration to suggest Crohn's or other inflammatory process. The
colon is negative for diverticula.
IMPRESSION: 1. Weight loss surgery in 5599 with numerous excluded small bowel
loops.
2. Enteric to mid transverse colon connection has a end-to-side
appearance most consistent with anastomosis rather than fistula.
Recent CT is supportive.
3. Cholecystectomy with cluster of dropped calculi about the right
liver.

## 2019-10-08 DIAGNOSIS — H3500 Unspecified background retinopathy: Secondary | ICD-10-CM | POA: Diagnosis not present

## 2019-10-08 DIAGNOSIS — H538 Other visual disturbances: Secondary | ICD-10-CM | POA: Diagnosis not present

## 2019-10-08 DIAGNOSIS — H25093 Other age-related incipient cataract, bilateral: Secondary | ICD-10-CM | POA: Diagnosis not present

## 2019-10-08 DIAGNOSIS — H52203 Unspecified astigmatism, bilateral: Secondary | ICD-10-CM | POA: Diagnosis not present

## 2019-10-10 DIAGNOSIS — H2513 Age-related nuclear cataract, bilateral: Secondary | ICD-10-CM | POA: Diagnosis not present

## 2019-10-10 DIAGNOSIS — H34832 Tributary (branch) retinal vein occlusion, left eye, with macular edema: Secondary | ICD-10-CM | POA: Diagnosis not present

## 2019-11-09 DIAGNOSIS — H34832 Tributary (branch) retinal vein occlusion, left eye, with macular edema: Secondary | ICD-10-CM | POA: Diagnosis not present

## 2019-12-07 DIAGNOSIS — H34832 Tributary (branch) retinal vein occlusion, left eye, with macular edema: Secondary | ICD-10-CM | POA: Diagnosis not present

## 2019-12-20 DIAGNOSIS — I272 Pulmonary hypertension, unspecified: Secondary | ICD-10-CM | POA: Diagnosis not present

## 2019-12-20 DIAGNOSIS — I288 Other diseases of pulmonary vessels: Secondary | ICD-10-CM | POA: Diagnosis not present

## 2019-12-20 DIAGNOSIS — I4891 Unspecified atrial fibrillation: Secondary | ICD-10-CM | POA: Diagnosis not present

## 2019-12-20 DIAGNOSIS — I083 Combined rheumatic disorders of mitral, aortic and tricuspid valves: Secondary | ICD-10-CM | POA: Diagnosis not present

## 2019-12-20 DIAGNOSIS — I4821 Permanent atrial fibrillation: Secondary | ICD-10-CM | POA: Diagnosis not present

## 2019-12-31 DIAGNOSIS — R062 Wheezing: Secondary | ICD-10-CM | POA: Diagnosis not present

## 2019-12-31 DIAGNOSIS — Z1152 Encounter for screening for COVID-19: Secondary | ICD-10-CM | POA: Diagnosis not present

## 2020-01-04 DIAGNOSIS — R069 Unspecified abnormalities of breathing: Secondary | ICD-10-CM | POA: Diagnosis not present

## 2020-01-04 DIAGNOSIS — I5189 Other ill-defined heart diseases: Secondary | ICD-10-CM | POA: Diagnosis not present

## 2020-01-04 DIAGNOSIS — I272 Pulmonary hypertension, unspecified: Secondary | ICD-10-CM | POA: Diagnosis not present

## 2020-01-04 DIAGNOSIS — Z7901 Long term (current) use of anticoagulants: Secondary | ICD-10-CM | POA: Diagnosis not present

## 2020-01-04 DIAGNOSIS — G4733 Obstructive sleep apnea (adult) (pediatric): Secondary | ICD-10-CM | POA: Diagnosis not present

## 2020-01-04 DIAGNOSIS — E669 Obesity, unspecified: Secondary | ICD-10-CM | POA: Diagnosis not present

## 2020-01-04 DIAGNOSIS — I4891 Unspecified atrial fibrillation: Secondary | ICD-10-CM | POA: Diagnosis not present

## 2020-01-15 DIAGNOSIS — H2513 Age-related nuclear cataract, bilateral: Secondary | ICD-10-CM | POA: Diagnosis not present

## 2020-01-15 DIAGNOSIS — H34832 Tributary (branch) retinal vein occlusion, left eye, with macular edema: Secondary | ICD-10-CM | POA: Diagnosis not present

## 2020-01-16 DIAGNOSIS — Z23 Encounter for immunization: Secondary | ICD-10-CM | POA: Diagnosis not present

## 2020-01-21 DIAGNOSIS — I10 Essential (primary) hypertension: Secondary | ICD-10-CM | POA: Diagnosis not present

## 2020-01-21 DIAGNOSIS — E7800 Pure hypercholesterolemia, unspecified: Secondary | ICD-10-CM | POA: Diagnosis not present

## 2020-01-21 DIAGNOSIS — E119 Type 2 diabetes mellitus without complications: Secondary | ICD-10-CM | POA: Diagnosis not present

## 2020-01-21 DIAGNOSIS — I480 Paroxysmal atrial fibrillation: Secondary | ICD-10-CM | POA: Diagnosis not present

## 2020-01-21 DIAGNOSIS — E1169 Type 2 diabetes mellitus with other specified complication: Secondary | ICD-10-CM | POA: Diagnosis not present

## 2020-01-21 DIAGNOSIS — I4891 Unspecified atrial fibrillation: Secondary | ICD-10-CM | POA: Diagnosis not present

## 2020-01-28 DIAGNOSIS — Z01818 Encounter for other preprocedural examination: Secondary | ICD-10-CM | POA: Diagnosis not present

## 2020-02-01 DIAGNOSIS — Z7901 Long term (current) use of anticoagulants: Secondary | ICD-10-CM | POA: Diagnosis not present

## 2020-02-01 DIAGNOSIS — I482 Chronic atrial fibrillation, unspecified: Secondary | ICD-10-CM | POA: Diagnosis not present

## 2020-02-01 DIAGNOSIS — Z01810 Encounter for preprocedural cardiovascular examination: Secondary | ICD-10-CM | POA: Diagnosis not present

## 2020-02-01 DIAGNOSIS — I272 Pulmonary hypertension, unspecified: Secondary | ICD-10-CM | POA: Diagnosis not present

## 2020-02-04 DIAGNOSIS — I272 Pulmonary hypertension, unspecified: Secondary | ICD-10-CM | POA: Diagnosis not present

## 2020-02-14 DIAGNOSIS — I272 Pulmonary hypertension, unspecified: Secondary | ICD-10-CM | POA: Diagnosis not present

## 2020-02-14 DIAGNOSIS — Z9049 Acquired absence of other specified parts of digestive tract: Secondary | ICD-10-CM | POA: Diagnosis not present

## 2020-02-14 DIAGNOSIS — Z23 Encounter for immunization: Secondary | ICD-10-CM | POA: Diagnosis not present

## 2020-02-14 DIAGNOSIS — D734 Cyst of spleen: Secondary | ICD-10-CM | POA: Diagnosis not present

## 2020-02-20 DIAGNOSIS — H34832 Tributary (branch) retinal vein occlusion, left eye, with macular edema: Secondary | ICD-10-CM | POA: Diagnosis not present

## 2020-04-29 IMAGING — US US ABDOMEN LIMITED
1 series · 14 of 17 positions shown · non-contrast
Comparison: CT 07/12/2017.

CLINICAL DATA: Follow-up splenic abnormality on previous CT.

EXAM:
ULTRASOUND ABDOMEN LIMITED left UPPER QUADRANT

[Series 1: us abdomen limited · 0.25mm/px · 14 of 17 slices shown]
[im 1/17]
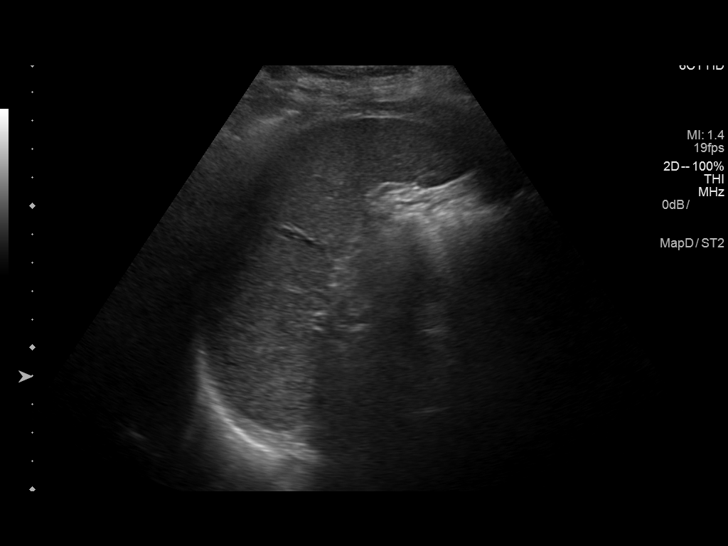
[im 2/17]
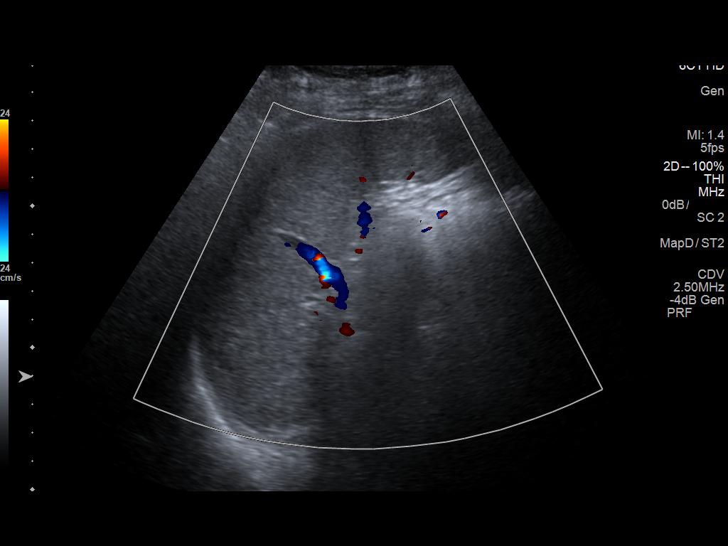
[im 4/17]
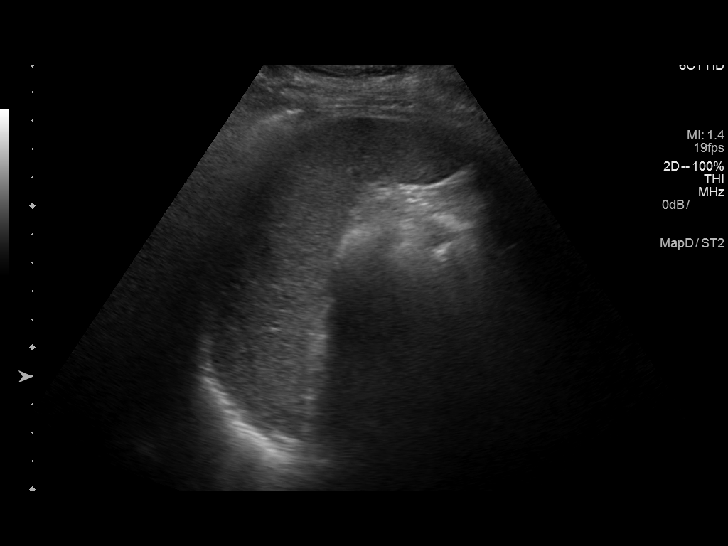
[im 5/17]
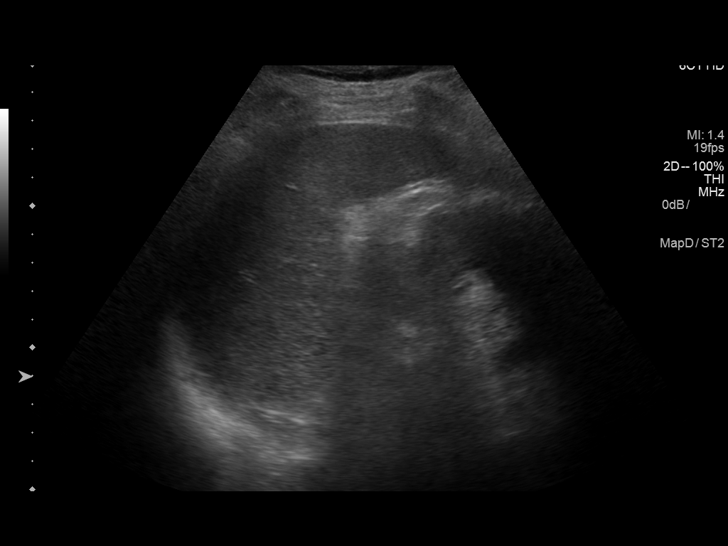
[im 6/17]
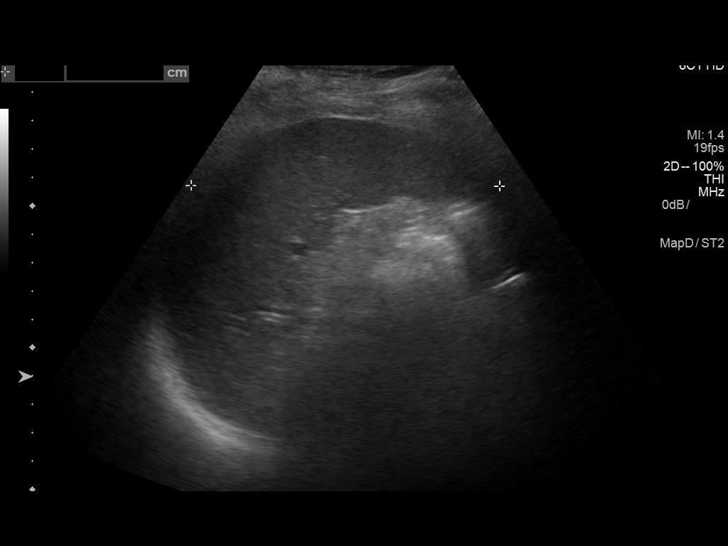
[im 7/17]
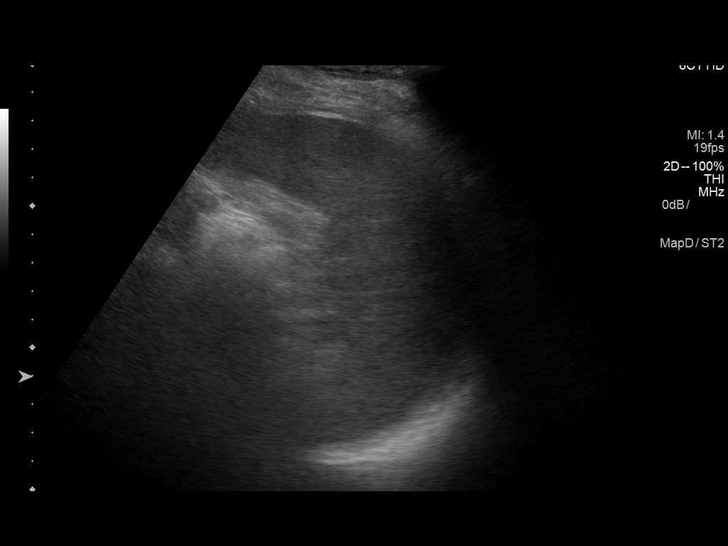
[im 8/17]
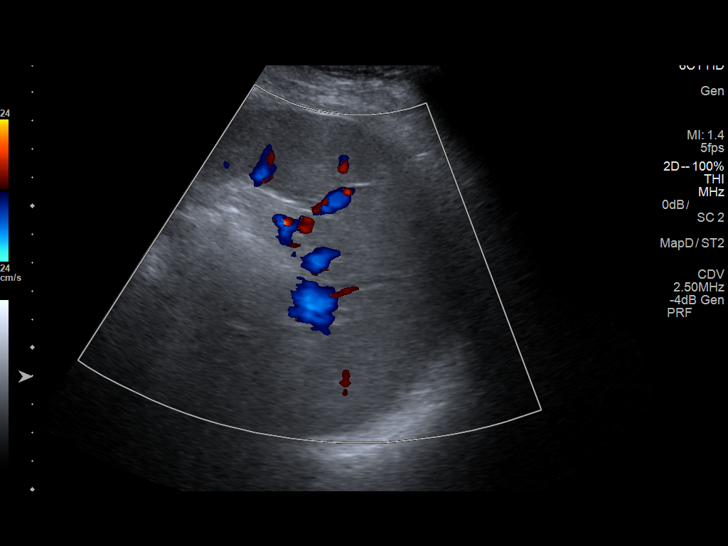
[im 10/17]
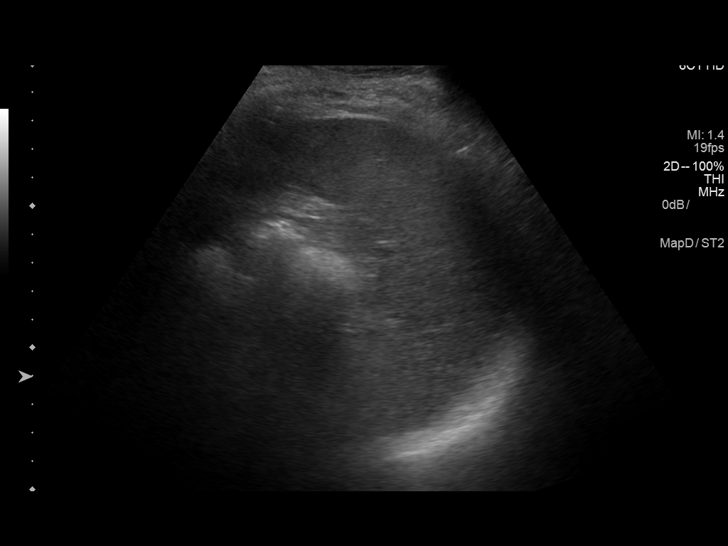
[im 11/17]
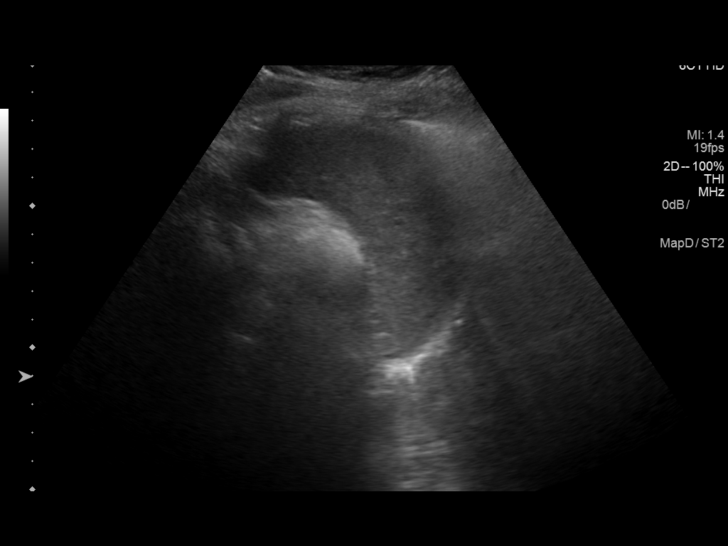
[im 12/17]
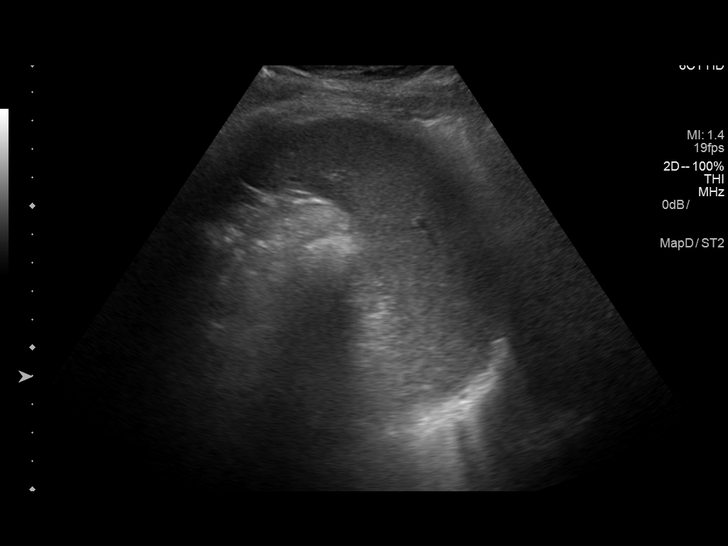
[im 13/17]
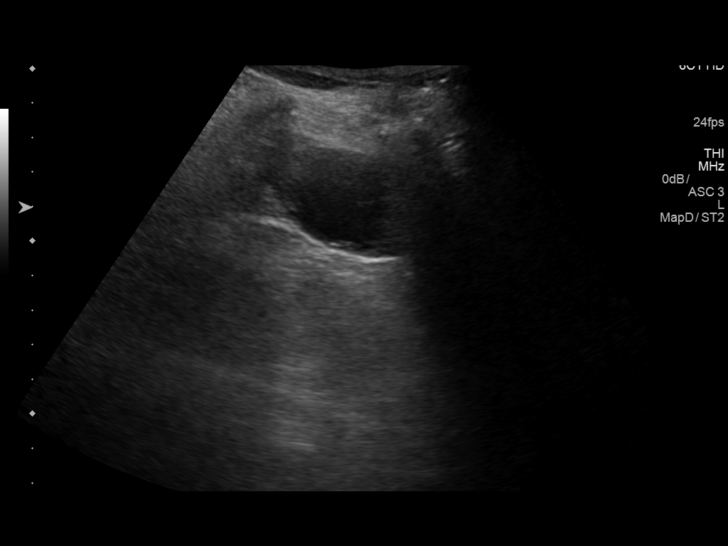
[im 14/17]
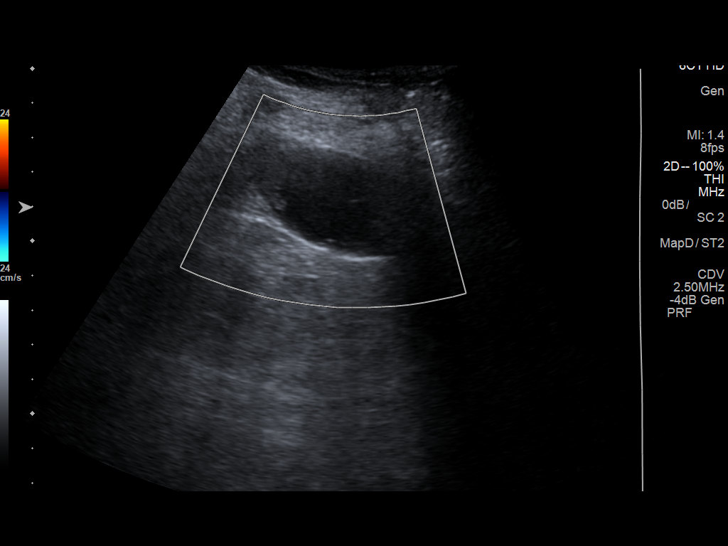
[im 16/17]
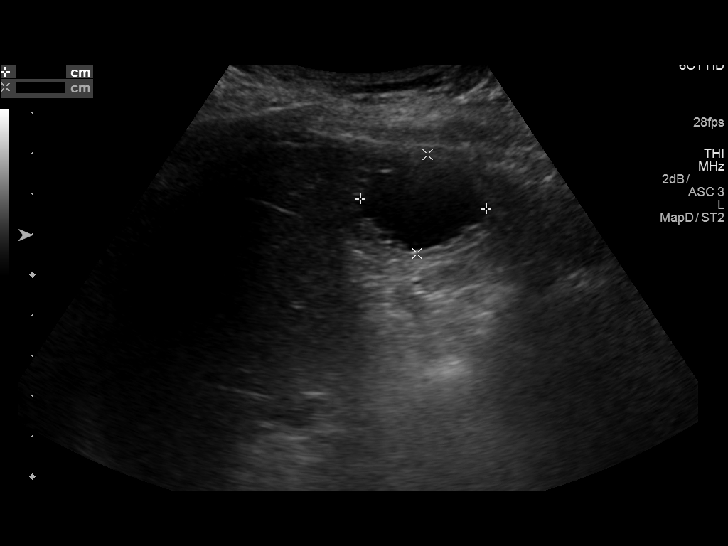
[im 17/17]
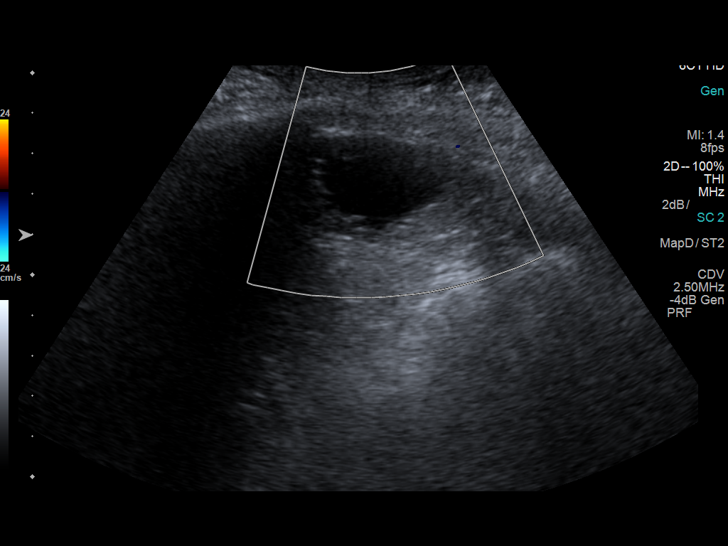

[14 of 17 positions shown; findings below may reference images not displayed]

FINDINGS: Evaluation of the spleen shows a lung fell 11 cm. At the tip of the
spleen, there is a 3.1 x 2.5 x 2.8 cm cyst. Walls are slightly
irregular as can be seen with splenic cysts. This does not appear
enlarged since the previous CT. No second splenic finding.
IMPRESSION: Consistent with a splenic cyst at the caudal tip. No enlargement
since the CT of 2 months ago

## 2021-08-18 ENCOUNTER — Other Ambulatory Visit: Payer: Self-pay | Admitting: *Deleted

## 2021-08-18 DIAGNOSIS — L2084 Intrinsic (allergic) eczema: Secondary | ICD-10-CM

## 2021-08-18 DIAGNOSIS — E559 Vitamin D deficiency, unspecified: Secondary | ICD-10-CM
